# Patient Record
Sex: Female | Born: 1995 | Race: Black or African American | Hispanic: No | Marital: Single | Smoking: Former smoker
Health system: Southern US, Community
[De-identification: ages and names within clinical notes are randomized; demographics above are authoritative.]

## PROBLEM LIST (undated history)

## (undated) DIAGNOSIS — F329 Major depressive disorder, single episode, unspecified: Secondary | ICD-10-CM

## (undated) DIAGNOSIS — IMO0002 Reserved for concepts with insufficient information to code with codable children: Secondary | ICD-10-CM

## (undated) HISTORY — PX: APPENDECTOMY: SHX54

## (undated) HISTORY — DX: Reserved for concepts with insufficient information to code with codable children: IMO0002

---

## 2014-11-26 ENCOUNTER — Emergency Department (HOSPITAL_COMMUNITY)
Admission: EM | Admit: 2014-11-26 | Discharge: 2014-11-26 | Disposition: A | Discharge status: Home / Self Care | Attending: Emergency Medicine | Admitting: Emergency Medicine

## 2014-11-26 ENCOUNTER — Encounter (HOSPITAL_COMMUNITY): Payer: Self-pay | Admitting: Family Medicine

## 2014-11-26 DIAGNOSIS — R05 Cough: Secondary | ICD-10-CM | POA: Diagnosis present

## 2014-11-26 DIAGNOSIS — B349 Viral infection, unspecified: Secondary | ICD-10-CM | POA: Insufficient documentation

## 2014-11-26 MED ORDER — ONDANSETRON 8 MG PO TBDP: 8.0000 mg | ORAL_TABLET | Freq: Three times a day (TID) | ORAL | Status: DC | PRN

## 2014-11-26 NOTE — ED Provider Notes (Signed)
CSN: [id number]     Arrival date & time 11/26/14  1222 History   First MD Initiated Contact with Patient 11/26/14 1240     Chief Complaint  Patient presents with  . Cough  . Emesis     (Consider location/radiation/quality/duration/timing/severity/associated sxs/prior Treatment) HPI 19 year old previously healthy female presents today stating that she has had cough, sore throat, nausea and vomiting for 2 weeks. It began with some coughing and then she had some rhinorrhea and sore throat. Over the past 4 days she's had nausea and vomiting. She had vomited one time this morning. She has not had fever or chills with this. She has been taking by mouth fluids but not eating solid foods well. She has been going to school and going to work. She is not a smoker and denies dyspnea. She has some mild headache and continues to have some sore throat. She has not noted any swelling or exudate in her throat. Her neck has not been stiff. She has had some cough but is not short of breath. She denies any abdominal pain or diarrhea. She denies UTI symptoms. She states that she has never been sexually active and her last menstrual period was normal at the end of last month.  History reviewed. No pertinent past medical history. Past Surgical History  Procedure Laterality Date  . Appendectomy     History reviewed. No pertinent family history. History  Substance Use Topics  . Smoking status: Never Smoker   . Smokeless tobacco: Not on file  . Alcohol Use: No   OB History    No data available     Review of Systems  All other systems reviewed and are negative.     Allergies  Review of patient's allergies indicates no known allergies.  Home Medications   Prior to Admission medications   Not on File   BP 116/83 mmHg  Pulse 100  Temp(Src) 98.4 F (36.9 C) (Oral)  Resp 20  Ht  (1.702 m)  Wt 142 lb 6.4 oz (64.592 kg)  BMI 22.30 kg/m2  SpO2 98%  LMP 11/04/2014 Physical Exam   Constitutional: She is oriented to person, place, and time. She appears well-developed and well-nourished.  HENT:  Head: Normocephalic and atraumatic.  Right Ear: External ear normal.  Left Ear: External ear normal.  Nose: Nose normal.  Mouth/Throat: Oropharynx is clear and moist.  Eyes: Conjunctivae and EOM are normal. Pupils are equal, round, and reactive to light.  Neck: Normal range of motion. Neck supple.  Cardiovascular: Normal rate, regular rhythm, normal heart sounds and intact distal pulses.   Pulmonary/Chest: Effort normal and breath sounds normal.  Abdominal: Soft. Bowel sounds are normal.  Musculoskeletal: Normal range of motion.  Neurological: She is alert and oriented to person, place, and time. She has normal reflexes.  Skin: Skin is warm and dry.  Psychiatric: She has a normal mood and affect. Her behavior is normal. Judgment and thought content normal.  Nursing note and vitals reviewed.   ED Course  Procedures (including critical care time) Labs Review Labs Reviewed - No data to display  Imaging Review No results found.   EKG Interpretation None      MDM   Final diagnoses:  Nonspecific syndrome suggestive of viral illness   Patient appears well hydrated here. She has not had any difficulty taking fluids today despite the one episode of emesis. Plan is to give her Zofran and treat her symptomatically. I have discussed doing a more aggressive workup  such as blood work and chest x-Roxi Hlavaty but do not feel that that is necessary at this time. I discussed this option with patient and she is in agreement to see down the conservative route of symptomatic treatment. She will stay out of work until Tuesday. She understands return precautions of inability to keep fluids down, high fever, or worsening of pain.    Hilario Quarryanielle S Umer Harig, MD 11/26/14 [id number]

## 2014-11-26 NOTE — ED Notes (Signed)
Pt here for cough, N,V and headache x 4 days.

## 2014-11-26 NOTE — Discharge Instructions (Signed)
Cough, Adult  A cough is a reflex that helps clear your throat and airways. It can help heal the body or may be a reaction to an irritated airway. A cough may only last 2 or 3 weeks (acute) or may last more than 8 weeks (chronic).  CAUSES Acute cough:  Viral or bacterial infections. Chronic cough:  Infections.  Allergies.  Asthma.  Post-nasal drip.  Smoking.  Heartburn or acid reflux.  Some medicines.  Chronic lung problems (COPD).  Cancer. SYMPTOMS   Cough.  Fever.  Chest pain.  Increased breathing rate.  High-pitched whistling sound when breathing (wheezing).  Colored mucus that you cough up (sputum). TREATMENT   A bacterial cough may be treated with antibiotic medicine.  A viral cough must run its course and will not respond to antibiotics.  Your caregiver may recommend other treatments if you have a chronic cough. HOME CARE INSTRUCTIONS   Only take over-the-counter or prescription medicines for pain, discomfort, or fever as directed by your caregiver. Use cough suppressants only as directed by your caregiver.  Use a cold steam vaporizer or humidifier in your bedroom or home to help loosen secretions.  Sleep in a semi-upright position if your cough is worse at night.  Rest as needed.  Stop smoking if you smoke. SEEK IMMEDIATE MEDICAL CARE IF:   You have pus in your sputum.  Your cough starts to worsen.  You cannot control your cough with suppressants and are losing sleep.  You begin coughing up blood.  You have difficulty breathing.  You develop pain which is getting worse or is uncontrolled with medicine.  You have a fever. MAKE SURE YOU:   Understand these instructions.  Will watch your condition.  Will get help right away if you are not doing well or get worse. Document Released: 03/22/2011 Document Revised: 12/16/2011 Document Reviewed: 03/22/2011 [facility] Patient Information 2015 [place], [place]. This information is not intended  to replace advice given to you by your health care provider. Make sure you discuss any questions you have with your health care provider. Nausea and Vomiting Nausea means you feel sick to your stomach. Throwing up (vomiting) is a reflex where stomach contents come out of your mouth. HOME CARE   Take medicine as told by your doctor.  Do not force yourself to eat. However, you do need to drink fluids.  If you feel like eating, eat a normal diet as told by your doctor.  Eat rice, wheat, potatoes, bread, lean meats, yogurt, fruits, and vegetables.  Avoid high-fat foods.  Drink enough fluids to keep your pee (urine) clear or pale yellow.  Ask your doctor how to replace body fluid losses (rehydrate). Signs of body fluid loss (dehydration) include:  Feeling very thirsty.  Dry lips and mouth.  Feeling dizzy.  Dark pee.  Peeing less than normal.  Feeling confused.  Fast breathing or heart rate. GET HELP RIGHT AWAY IF:   You have blood in your throw up.  You have black or bloody poop (stool).  You have a bad headache or stiff neck.  You feel confused.  You have bad belly (abdominal) pain.  You have chest pain or trouble breathing.  You do not pee at least once every 8 hours.  You have cold, clammy skin.  You keep throwing up after 24 to 48 hours.  You have a fever. MAKE SURE YOU:   Understand these instructions.  Will watch your condition.  Will get help right away if you are  not doing well or get worse. Document Released: 03/11/2008 Document Revised: 12/16/2011 Document Reviewed: 02/22/2011 [facility] Patient Information 2015 [place], [place]. This information is not intended to replace advice given to you by your health care provider. Make sure you discuss any questions you have with your health care provider.

## 2015-06-03 ENCOUNTER — Encounter (HOSPITAL_COMMUNITY): Payer: Self-pay | Admitting: Nurse Practitioner

## 2015-06-03 ENCOUNTER — Emergency Department (HOSPITAL_COMMUNITY)
Admission: EM | Admit: 2015-06-03 | Discharge: 2015-06-03 | Disposition: A | Discharge status: Home / Self Care | Attending: Emergency Medicine | Admitting: Emergency Medicine

## 2015-06-03 DIAGNOSIS — S0993XA Unspecified injury of face, initial encounter: Secondary | ICD-10-CM | POA: Diagnosis present

## 2015-06-03 DIAGNOSIS — S025XXA Fracture of tooth (traumatic), initial encounter for closed fracture: Secondary | ICD-10-CM | POA: Insufficient documentation

## 2015-06-03 DIAGNOSIS — Y9389 Activity, other specified: Secondary | ICD-10-CM | POA: Insufficient documentation

## 2015-06-03 DIAGNOSIS — Y9289 Other specified places as the place of occurrence of the external cause: Secondary | ICD-10-CM | POA: Insufficient documentation

## 2015-06-03 DIAGNOSIS — X58XXXA Exposure to other specified factors, initial encounter: Secondary | ICD-10-CM | POA: Diagnosis not present

## 2015-06-03 DIAGNOSIS — Y998 Other external cause status: Secondary | ICD-10-CM | POA: Diagnosis not present

## 2015-06-03 MED ORDER — NAPROXEN 500 MG PO TABS: 500.0000 mg | ORAL_TABLET | Freq: Two times a day (BID) | ORAL | Status: DC

## 2015-06-03 MED ORDER — AMOXICILLIN 500 MG PO CAPS: 500.0000 mg | ORAL_CAPSULE | Freq: Three times a day (TID) | ORAL | Status: DC

## 2015-06-03 NOTE — Discharge Instructions (Signed)
°  Call Dr. Kelly Splinter office Monday morning and tell them you were seen here and need a follow up for your broken tooth.

## 2015-06-03 NOTE — ED Notes (Signed)
She states she has a chipped tooth on R upper gum that has come loose and now she can feel a piece of it wiggling when she tries to eat

## 2015-06-03 NOTE — ED Notes (Signed)
Declined W/C at D/C and was escorted to lobby by RN. 

## 2015-06-03 NOTE — ED Provider Notes (Signed)
CSN: [id number]     Arrival date & time 06/03/15  1537 History  This chart was scribed for [facility], NP working with Tilden Fossa, MD by Evon Slack, ED Scribe. This patient was seen in room TR05C/TR05C and the patient's care was started at 4:00 PM.    Chief Complaint  Patient presents with  . Dental Pain   Patient is a 19 y.o. female presenting with tooth pain. The history is provided by the patient. No language interpreter was used.  Dental Pain Location:  Upper Upper teeth location:  3/RU 1st molar Severity:  Moderate Duration:  1 day Timing:  Constant Chronicity:  New Context: dental fracture   Relieved by:  None tried Worsened by:  Hot food/drink Associated symptoms: no fever    HPI Comments: Tammy Lowe is a 19 y.o. female who presents to the Emergency Department complaining of right upper dental pain onset this morning at 2 AM. Pt states she bit down on a fork and chipped the tooth. She states that the pain is worse when eating. Pt doesn't report any medications PTA.  Pt denies fever, chills or ear pain. Pt states that she does not have a dentist.   History reviewed. No pertinent past medical history. Past Surgical History  Procedure Laterality Date  . Appendectomy     History reviewed. No pertinent family history. Social History  Substance Use Topics  . Smoking status: Never Smoker   . Smokeless tobacco: None  . Alcohol Use: Yes   OB History    No data available     Review of Systems  Constitutional: Negative for fever and chills.  HENT: Positive for dental problem. Negative for ear pain.   All other systems reviewed and are negative.     Allergies  Review of patient's allergies indicates no known allergies.  Home Medications   Prior to Admission medications   Medication Sig Start Date End Date Taking? Authorizing Provider  amoxicillin (AMOXIL) 500 MG capsule Take 1 capsule (500 mg total) by mouth 3 (three) times daily. 06/03/15   [name], NP  naproxen (NAPROSYN) 500 MG tablet Take 1 tablet (500 mg total) by mouth 2 (two) times daily. 06/03/15   [name], NP  ondansetron (ZOFRAN ODT) 8 MG disintegrating tablet Take 1 tablet (8 mg total) by mouth every 8 (eight) hours as needed for nausea or vomiting. 11/26/14   Margarita Grizzle, MD   BP 103/65 mmHg  Pulse 70  Temp(Src) 98.5 F (36.9 C) (Oral)  Resp 18  SpO2 97%   Physical Exam  Constitutional: She is oriented to person, place, and time. She appears well-developed and well-nourished. No distress.  HENT:  Head: Normocephalic and atraumatic.  Mouth/Throat: Uvula is midline, oropharynx is clear and moist and mucous membranes are normal.    Right upper first molar broken and loose.   Eyes: Conjunctivae and EOM are normal.  Neck: Normal range of motion. Neck supple. No tracheal deviation present.  Cardiovascular: Normal rate and regular rhythm.   Pulmonary/Chest: Effort normal and breath sounds normal. No respiratory distress.  Musculoskeletal: Normal range of motion.  Lymphadenopathy:    She has no cervical adenopathy.  Neurological: She is alert and oriented to person, place, and time.  Skin: Skin is warm and dry.  Psychiatric: She has a normal mood and affect. Her behavior is normal.  Nursing note and vitals reviewed.   ED Course  Procedures (including critical care time) DIAGNOSTIC STUDIES: Oxygen Saturation  is 97% on RA, normal by my interpretation.    COORDINATION OF CARE: 4:07 PM-Discussed treatment plan with pt at bedside and pt agreed to plan.     MDM  19 y.o. female with dental pain due to broken tooth. Stable for d/c without fever and does not appear toxic. Will start antibiotics and NSAIDS and patient will call the on call dentist for a follow up appointment.   Final diagnoses:  Broken tooth, closed, initial encounter   I personally performed the services described in this documentation, which was scribed in my presence. The recorded  information has been reviewed and is accurate.      [address] [place], NP 06/03/15 Merrily Brittle  Tilden Fossa, MD 06/04/15 [phone]

## 2016-03-06 ENCOUNTER — Encounter (HOSPITAL_COMMUNITY): Payer: Self-pay | Admitting: *Deleted

## 2016-03-06 ENCOUNTER — Emergency Department (HOSPITAL_COMMUNITY)
Admission: EM | Admit: 2016-03-06 | Discharge: 2016-03-06 | Disposition: A | Discharge status: Home / Self Care | Attending: Emergency Medicine | Admitting: Emergency Medicine

## 2016-03-06 ENCOUNTER — Emergency Department (HOSPITAL_COMMUNITY)

## 2016-03-06 DIAGNOSIS — N644 Mastodynia: Secondary | ICD-10-CM | POA: Diagnosis present

## 2016-03-06 DIAGNOSIS — R0789 Other chest pain: Secondary | ICD-10-CM | POA: Diagnosis not present

## 2016-03-06 DIAGNOSIS — Z791 Long term (current) use of non-steroidal anti-inflammatories (NSAID): Secondary | ICD-10-CM | POA: Insufficient documentation

## 2016-03-06 DIAGNOSIS — F17210 Nicotine dependence, cigarettes, uncomplicated: Secondary | ICD-10-CM | POA: Diagnosis not present

## 2016-03-06 LAB — URINALYSIS, ROUTINE W REFLEX MICROSCOPIC
BILIRUBIN URINE: NEGATIVE
Glucose, UA: NEGATIVE mg/dL
Hgb urine dipstick: NEGATIVE
Ketones, ur: NEGATIVE mg/dL
LEUKOCYTES UA: NEGATIVE
NITRITE: NEGATIVE
PROTEIN: NEGATIVE mg/dL
Specific Gravity, Urine: 1.033 — ABNORMAL HIGH (ref 1.005–1.030)
pH: 5.5 (ref 5.0–8.0)

## 2016-03-06 LAB — PREGNANCY, URINE: Preg Test, Ur: NEGATIVE

## 2016-03-06 MED ORDER — ORPHENADRINE CITRATE ER 100 MG PO TB12: 100.0000 mg | ORAL_TABLET | Freq: Two times a day (BID) | ORAL | Status: DC

## 2016-03-06 MED ORDER — IBUPROFEN 800 MG PO TABS
800.0000 mg | ORAL_TABLET | Freq: Once | ORAL | Status: AC
  Administered 2016-03-06: 800 mg via ORAL
  Filled 2016-03-06: qty 1

## 2016-03-06 MED ORDER — IBUPROFEN 600 MG PO TABS: 600.0000 mg | ORAL_TABLET | Freq: Four times a day (QID) | ORAL | Status: DC | PRN

## 2016-03-06 NOTE — Discharge Instructions (Signed)

## 2016-03-06 NOTE — ED Notes (Signed)
Pt reports L side pain under her L breast x "a few days", worse with inspiration or when coughing or laughing.  Denies any radiation at this time.

## 2016-03-06 NOTE — ED Provider Notes (Signed)
CSN: [id number]     Arrival date & time 03/06/16  1451 History   First MD Initiated Contact with Patient 03/06/16 1530     Chief Complaint  Patient presents with  . L side pain      (Consider location/radiation/quality/duration/timing/severity/associated sxs/prior Treatment) HPI Patient has had pain under her left breast for about 3 days. Very sharp. It is worse with movement cough or laughing. She reports her couple weeks she's had a mild cough. No sputum production and no fever or chills. She reports that she dances a lot but denies any specific movements or activities she is done to strain her chest wall. She reports however movements do make it worse. No history of DVT or PE. He is currently on menstrual cycle denies use of birth control. No recent travel. History reviewed. No pertinent past medical history. Past Surgical History  Procedure Laterality Date  . Appendectomy     No family history on file. Social History  Substance Use Topics  . Smoking status: Current Some Day Smoker    Types: Cigarettes  . Smokeless tobacco: None  . Alcohol Use: Yes     Comment: occa   OB History    No data available     Review of Systems 10 Systems reviewed and are negative for acute change except as noted in the HPI.    Allergies  Review of patient's allergies indicates no known allergies.  Home Medications   Prior to Admission medications   Medication Sig Start Date End Date Taking? Authorizing Provider  amoxicillin (AMOXIL) 500 MG capsule Take 1 capsule (500 mg total) by mouth 3 (three) times daily. Patient not taking: Reported on 03/06/2016 06/03/15   Janne NapoleonHope M Neese, NP  ibuprofen (ADVIL,MOTRIN) 600 MG tablet Take 1 tablet (600 mg total) by mouth every 6 (six) hours as needed. 03/06/16   Arby BarretteMarcy Stanislaw Acton, MD  naproxen (NAPROSYN) 500 MG tablet Take 1 tablet (500 mg total) by mouth 2 (two) times daily. Patient not taking: Reported on 03/06/2016 06/03/15   Janne NapoleonHope M Neese, NP  ondansetron  (ZOFRAN ODT) 8 MG disintegrating tablet Take 1 tablet (8 mg total) by mouth every 8 (eight) hours as needed for nausea or vomiting. Patient not taking: Reported on 03/06/2016 11/26/14   Margarita Grizzleanielle Ray, MD  orphenadrine (NORFLEX) 100 MG tablet Take 1 tablet (100 mg total) by mouth 2 (two) times daily. 03/06/16   Arby BarretteMarcy Shamel Germond, MD   BP 110/79 mmHg  Pulse 71  Temp(Src) 98.5 F (36.9 C) (Oral)  Resp 17  Ht 5\' 7"  (1.702 m)  Wt 125 lb (56.7 kg)  BMI 19.57 kg/m2  SpO2 99%  LMP 03/04/2016 Physical Exam  Constitutional: She is oriented to person, place, and time. She appears well-developed and well-nourished.  HENT:  Head: Normocephalic and atraumatic.  Eyes: EOM are normal. Pupils are equal, round, and reactive to light.  Neck: Neck supple.  Cardiovascular: Normal rate, regular rhythm, normal heart sounds and intact distal pulses.   Pulmonary/Chest: Effort normal and breath sounds normal. She exhibits tenderness.  Very focal and reproducible pain along the ninth and 10th ribs. Patient winces with palpation in this localized region. No rash or palpable anomaly.  Abdominal: Soft. Bowel sounds are normal. She exhibits no distension. There is no tenderness.  Musculoskeletal: Normal range of motion. She exhibits no edema or tenderness.  Neurological: She is alert and oriented to person, place, and time. She has normal strength. Coordination normal. GCS eye subscore is 4. GCS verbal subscore  is 5. GCS motor subscore is 6.  Skin: Skin is warm, dry and intact.  Psychiatric: She has a normal mood and affect.    ED Course  Procedures (including critical care time) Labs Review Labs Reviewed  URINALYSIS, ROUTINE W REFLEX MICROSCOPIC (NOT AT [facility]) - Abnormal; Notable for the following:    Specific Gravity, Urine 1.033 (*)    All other components within normal limits  PREGNANCY, URINE    Imaging Review Dg Chest 2 View  03/06/2016  CLINICAL DATA:  Left-sided chest pain and dry cough 1 week. EXAM:  CHEST  2 VIEW COMPARISON:  None. FINDINGS: The heart size and mediastinal contours are within normal limits. Both lungs are clear. The visualized skeletal structures are unremarkable. IMPRESSION: No active cardiopulmonary disease. Electronically Signed   By: Elberta Fortis M.D.   On: 03/06/2016 16:09   I have personally reviewed and evaluated these images and lab results as part of my medical decision-making.   EKG Interpretation None      MDM   Final diagnoses:  Chest wall pain   Patient is clinically well appearance. Stable vital signs. No recent travel, birth control or PE DVT risk factors. Chest pain is very reproducible on examination. This is consistent with chest wall strain. Patient be treated with ibuprofen and Norflex for muscle spasm. Return instructions reviewed. Patient is counseled on the necessity of follow-up.    Arby Barrette, MD 03/06/16 [phone]

## 2016-09-02 ENCOUNTER — Emergency Department (HOSPITAL_COMMUNITY)
Admission: EM | Admit: 2016-09-02 | Discharge: 2016-09-02 | Disposition: A | Discharge status: Home / Self Care | Attending: Emergency Medicine | Admitting: Emergency Medicine

## 2016-09-02 ENCOUNTER — Encounter (HOSPITAL_COMMUNITY): Payer: Self-pay | Admitting: *Deleted

## 2016-09-02 DIAGNOSIS — F17210 Nicotine dependence, cigarettes, uncomplicated: Secondary | ICD-10-CM | POA: Insufficient documentation

## 2016-09-02 DIAGNOSIS — N946 Dysmenorrhea, unspecified: Secondary | ICD-10-CM | POA: Diagnosis present

## 2016-09-02 LAB — POC URINE PREG, ED: Preg Test, Ur: NEGATIVE

## 2016-09-02 MED ORDER — NAPROXEN 500 MG PO TABS: 500.0000 mg | ORAL_TABLET | Freq: Two times a day (BID) | ORAL | 0 refills | Status: DC

## 2016-09-02 MED ORDER — ONDANSETRON 4 MG PO TBDP
4.0000 mg | ORAL_TABLET | Freq: Once | ORAL | Status: AC
  Administered 2016-09-02: 4 mg via ORAL

## 2016-09-02 MED ORDER — ONDANSETRON 4 MG PO TBDP
ORAL_TABLET | ORAL | Status: AC
  Filled 2016-09-02: qty 1

## 2016-09-02 MED ORDER — NAPROXEN 250 MG PO TABS
500.0000 mg | ORAL_TABLET | Freq: Once | ORAL | Status: AC
  Administered 2016-09-02: 500 mg via ORAL
  Filled 2016-09-02: qty 2

## 2016-09-02 NOTE — Discharge Instructions (Signed)
[facility]. Other option is to call your insurance company and get a list of Gynecologist who are under your insurance network. As discussed - close gynecologist follow up will be very helpful.

## 2016-09-02 NOTE — ED Notes (Signed)
Patient moved physically from hallway to room A10; patient getting undressed and into a gown at this time

## 2016-09-02 NOTE — ED Triage Notes (Addendum)
Pt states the last 12 menstrual cycles have been increasingly painful.  Started this cycle last night.  C/o nausea with pain.  Pt has been taking 500 mg ibuprofen with no relief.

## 2016-09-02 NOTE — ED Provider Notes (Signed)
[facility]-EMERGENCY DEPT Provider Note   CSN: [id number] Arrival date & time: 09/02/16  [id number]     History   Chief Complaint Chief Complaint  Patient presents with  . Abdominal Cramping    MENSTRUAL    HPI Tammy Lowe is a 20 y.o. female.  HPI Pt with hx of appendectomy comes in with cc of menstrual cramps. Pt reports that for the last 1 month she has been having regular periods, but with them she gets severe cramping pain. The cramping pain is L sided and in the suprapubic region. Pt has no vaginal discharge with it. Pt denies any hx of STD and denies unprotected intercourse or multiple partners or high risk sexual behavior. She does indicate that her mother needed hysterectomy and there is endometriosis in her family, so she wanted to be checked out.    History reviewed. No pertinent past medical history.  There are no active problems to display for this patient.   Past Surgical History:  Procedure Laterality Date  . APPENDECTOMY      OB History    No data available       Home Medications    Prior to Admission medications   Medication Sig Start Date End Date Taking? Authorizing Provider  amoxicillin (AMOXIL) 500 MG capsule Take 1 capsule (500 mg total) by mouth 3 (three) times daily. Patient not taking: Reported on 03/06/2016 06/03/15   Janne NapoleonHope M Neese, NP  ibuprofen (ADVIL,MOTRIN) 600 MG tablet Take 1 tablet (600 mg total) by mouth every 6 (six) hours as needed. 03/06/16   Arby BarretteMarcy Pfeiffer, MD  naproxen (NAPROSYN) 500 MG tablet Take 1 tablet (500 mg total) by mouth 2 (two) times daily. 09/02/16   Derwood KaplanAnkit Coraleigh Sheeran, MD  ondansetron (ZOFRAN ODT) 8 MG disintegrating tablet Take 1 tablet (8 mg total) by mouth every 8 (eight) hours as needed for nausea or vomiting. Patient not taking: Reported on 03/06/2016 11/26/14   Margarita Grizzleanielle Ray, MD  orphenadrine (NORFLEX) 100 MG tablet Take 1 tablet (100 mg total) by mouth 2 (two) times daily. 03/06/16   Arby BarretteMarcy Pfeiffer, MD    Family  History No family history on file.  Social History Social History  Substance Use Topics  . Smoking status: Current Some Day Smoker    Packs/day: 0.25    Years: 0.00    Types: Cigarettes  . Smokeless tobacco: Never Used  . Alcohol use Yes     Comment: occa     Allergies   Patient has no known allergies.   Review of Systems Review of Systems  Constitutional: Positive for activity change.  Gastrointestinal: Positive for abdominal pain. Negative for nausea and vomiting.  Genitourinary: Positive for pelvic pain. Negative for difficulty urinating, dysuria, flank pain, frequency, hematuria, menstrual problem, vaginal bleeding, vaginal discharge and vaginal pain.  Allergic/Immunologic: Negative for immunocompromised state.  Hematological: Does not bruise/bleed easily.     Physical Exam Updated Vital Signs BP 119/89 (BP Location: Right Arm)   Pulse 61   Temp 98.3 F (36.8 C) (Oral)   Resp 16   Ht 5\' 7"  (1.702 m)   Wt 122 lb (55.3 kg)   LMP 09/01/2016   SpO2 99%   BMI 19.11 kg/m   Physical Exam  Constitutional: She is oriented to person, place, and time. She appears well-developed.  HENT:  Head: Normocephalic and atraumatic.  Eyes: EOM are normal.  Neck: Normal range of motion. Neck supple.  Cardiovascular: Normal rate.   Pulmonary/Chest: Effort normal.  Abdominal: Bowel sounds  are normal. There is tenderness.  LLQ and suprapubic tenderness  Neurological: She is alert and oriented to person, place, and time.  Skin: Skin is warm and dry.  Nursing note and vitals reviewed.    ED Treatments / Results  Labs (all labs ordered are listed, but only abnormal results are displayed) Labs Reviewed  POC URINE PREG, ED    EKG  EKG Interpretation None       Radiology No results found.  Procedures Procedures (including critical care time)  Medications Ordered in ED Medications  ondansetron (ZOFRAN-ODT) 4 MG disintegrating tablet (not administered)   ondansetron (ZOFRAN-ODT) disintegrating tablet 4 mg (4 mg Oral Given 09/02/16 0731)  naproxen (NAPROSYN) tablet 500 mg (500 mg Oral Given 09/02/16 [id number])     Initial Impression / Assessment and Plan / ED Course  I have reviewed the triage vital signs and the nursing notes.  Pertinent labs & imaging results that were available during my care of the patient were reviewed by me and considered in my medical decision making (see chart for details).  Clinical Course     Pt comes in with menstrual cramps that have worsened over the course of the last year. Pain only related to menstruation. No STD risk factors. Pt has insurance, and I think her care will be best managed as an outpatient by a Gynecologist - f/u info provided. Preg test is neg. No need for emergent US or pelvic exam.  Final Clinical Impressions(s) / ED Diagnoses   Final diagnoses:  Dysmenorrhea    New Prescriptions Discharge Medication List as of 09/02/2016  9:17 AM       Derwood KaplanAnkit Poseidon Pam, MD 09/02/16 [phone]

## 2016-12-03 ENCOUNTER — Emergency Department (HOSPITAL_COMMUNITY)
Admission: EM | Admit: 2016-12-03 | Discharge: 2016-12-03 | Disposition: A | Discharge status: Home / Self Care | Attending: Emergency Medicine | Admitting: Emergency Medicine

## 2016-12-03 ENCOUNTER — Encounter (HOSPITAL_COMMUNITY): Payer: Self-pay | Admitting: Emergency Medicine

## 2016-12-03 DIAGNOSIS — X102XXA Contact with fats and cooking oils, initial encounter: Secondary | ICD-10-CM | POA: Diagnosis not present

## 2016-12-03 DIAGNOSIS — F17210 Nicotine dependence, cigarettes, uncomplicated: Secondary | ICD-10-CM | POA: Insufficient documentation

## 2016-12-03 DIAGNOSIS — Y9389 Activity, other specified: Secondary | ICD-10-CM | POA: Insufficient documentation

## 2016-12-03 DIAGNOSIS — T23262A Burn of second degree of back of left hand, initial encounter: Secondary | ICD-10-CM | POA: Insufficient documentation

## 2016-12-03 DIAGNOSIS — Y999 Unspecified external cause status: Secondary | ICD-10-CM | POA: Diagnosis not present

## 2016-12-03 DIAGNOSIS — T23062A Burn of unspecified degree of back of left hand, initial encounter: Secondary | ICD-10-CM | POA: Diagnosis present

## 2016-12-03 DIAGNOSIS — Y929 Unspecified place or not applicable: Secondary | ICD-10-CM | POA: Diagnosis not present

## 2016-12-03 MED ORDER — HYDROCODONE-ACETAMINOPHEN 5-325 MG PO TABS
1.0000 | ORAL_TABLET | Freq: Once | ORAL | Status: AC
  Administered 2016-12-03: 1 via ORAL
  Filled 2016-12-03: qty 1

## 2016-12-03 MED ORDER — IBUPROFEN 800 MG PO TABS: 800.0000 mg | ORAL_TABLET | Freq: Three times a day (TID) | ORAL | 0 refills | Status: DC

## 2016-12-03 MED ORDER — SILVER SULFADIAZINE 1 % EX CREA
TOPICAL_CREAM | Freq: Once | CUTANEOUS | Status: AC
  Administered 2016-12-03: 1 via TOPICAL
  Filled 2016-12-03: qty 50

## 2016-12-03 NOTE — ED Provider Notes (Signed)
[facility]-EMERGENCY DEPT Provider Note   CSN: [id number] Arrival date & time: 12/03/16  2136     History   Chief Complaint Chief Complaint  Patient presents with  . Burn    HPI ARLAYNE LIGGINS is a 21 y.o. female.  The history is provided by the patient and medical records. No language interpreter was used.  Burn    Ottie Glazier is an otherwise healthy right hand dominant 21 y.o. female  who presents to the Emergency Department for burn to the left hand just prior to arrival. Patient states that she was cooking when she accidentally poured baking grease onto the left hand. It began to blister shortly after and she experienced 10/10 pain. She ran her hand under cold water provided adequate pain relief, however she states every time she takes her hand out of the water, pain returns. She denies any numbness or tingling. Denies any difficulty moving the hand. No medications taken prior to arrival for symptoms.  History reviewed. No pertinent past medical history.  There are no active problems to display for this patient.   Past Surgical History:  Procedure Laterality Date  . APPENDECTOMY      OB History    No data available       Home Medications    Prior to Admission medications   Medication Sig Start Date End Date Taking? Authorizing Provider  amoxicillin (AMOXIL) 500 MG capsule Take 1 capsule (500 mg total) by mouth 3 (three) times daily. Patient not taking: Reported on 03/06/2016 06/03/15   Janne Napoleon, NP  ibuprofen (ADVIL,MOTRIN) 600 MG tablet Take 1 tablet (600 mg total) by mouth every 6 (six) hours as needed. 03/06/16   Arby Barrette, MD  naproxen (NAPROSYN) 500 MG tablet Take 1 tablet (500 mg total) by mouth 2 (two) times daily. 09/02/16   Derwood Kaplan, MD  ondansetron (ZOFRAN ODT) 8 MG disintegrating tablet Take 1 tablet (8 mg total) by mouth every 8 (eight) hours as needed for nausea or vomiting. Patient not taking: Reported on 03/06/2016 11/26/14   Margarita Grizzle, MD  orphenadrine (NORFLEX) 100 MG tablet Take 1 tablet (100 mg total) by mouth 2 (two) times daily. 03/06/16   Arby Barrette, MD    Family History History reviewed. No pertinent family history.  Social History Social History  Substance Use Topics  . Smoking status: Current Some Day Smoker    Packs/day: 0.25    Years: 0.00    Types: Cigarettes  . Smokeless tobacco: Never Used  . Alcohol use Yes     Comment: occa     Allergies   Patient has no known allergies.   Review of Systems Review of Systems  Musculoskeletal: Negative for joint swelling.  Skin: Positive for wound.  Neurological: Negative for weakness and numbness.     Physical Exam Updated Vital Signs BP 131/85 (BP Location: Right Arm)   Pulse 78   Temp 98.4 F (36.9 C) (Oral)   Resp 16   Ht 5\' 7"  (1.702 m)   SpO2 100%   Physical Exam  Constitutional: She is oriented to person, place, and time. She appears well-developed and well-nourished. No distress.  HENT:  Head: Normocephalic and atraumatic.  Cardiovascular: Normal rate, regular rhythm and normal heart sounds.   No murmur heard. Pulmonary/Chest: Effort normal and breath sounds normal. No respiratory distress.  Musculoskeletal:  Left hand with full range of motion and good grip strength. 2+ radial pulse. Sensation intact.  Neurological: She is alert  and oriented to person, place, and time.  Skin: Skin is warm and dry.  3x6 cm intact blister to the dorsum of the left hand which is very tender to palpation.  Nursing note and vitals reviewed.    ED Treatments / Results  Labs (all labs ordered are listed, but only abnormal results are displayed) Labs Reviewed - No data to display  EKG  EKG Interpretation None       Radiology No results found.  Procedures Procedures (including critical care time)  Medications Ordered in ED Medications  silver sulfADIAZINE (SILVADENE) 1 % cream (1 application Topical Given 12/03/16 2318)    HYDROcodone-acetaminophen (NORCO/VICODIN) 5-325 MG per tablet 1 tablet (1 tablet Oral Given 12/03/16 2318)     Initial Impression / Assessment and Plan / ED Course  I have reviewed the triage vital signs and the nursing notes.  Pertinent labs & imaging results that were available during my care of the patient were reviewed by me and considered in my medical decision making (see chart for details).    Ottie GlazierLateisha P Loma is a 21 y.o. female who presents to ED for burn to the dorsum of the left hand. Blisters are intact on exam. LUE NVI. Pain controlled in ED. Silvadene cream provided. Symptomatic home/wound care instructions discussed. Ibuprofen as needed for pain. PCP follow-up strongly encouraged. Reasons to return to ER including signs of infection were discussed and all questions were answered.  Final Clinical Impressions(s) / ED Diagnoses   Final diagnoses:  None    New Prescriptions New Prescriptions   No medications on file     [facility] HospitalJaime Pilcher Cintia Gleed, PA-C 12/03/16 2328    Dione Boozeavid Glick, MD 12/04/16 [phone]

## 2016-12-03 NOTE — Discharge Instructions (Signed)
Ibuprofen as needed for pain.  Return to ER for signs of infection such as worsening redness, drainage from the wound, fevers. You may also return for new or worsening symptoms, ay additional concerns.   SKIN BURN TREATMENT -- Small superficial or superficial partial-thickness burns can often be treated at home. However, burns that are larger or deeper should be evaluated by a healthcare provider.  Home treatment of skin burns should include cleaning the area, immediately cooling it, preventing infection, and managing pain.  Clean the area -- Remove any clothing from the burned area. If clothing is stuck to the skin, do not try to remove it and seek emergency medical care.  Wash the burned skin gently with cool tap water and plain soap. It is not necessary to disinfect the skin with alcohol, iodine, or other cleansers.  Cool the area -- After cleaning the skin, you may apply a cold compress to the skin or soak the area in cool (not ice) water for a brief period of time to reduce pain and reduce the extent of the burn. Avoid placing ice directly on the skin because this can damage the skin further.  Prevent infection -- To prevent infection in partial-thickness and more severe burns, apply aloe vera or an antibiotic cream, such as bacitracin. Do not apply ointments or other substances (eg, mustard, egg whites, mayonnaise, lavender oil, emu oil, toothpaste) to skin burns. Keep burns clean by washing the burned area daily with soap (does not need to be antibacterial) and water.  Minor burns may be covered with a bandage or dressing if you wish; burns that form blisters should be covered with a clean bandage or dressing. A bandage that does not stick to the skin (labeled as "non-stick") is preferred for the first layer. Change the dressing once or twice per day, as needed.  Do not try to break open skin blisters with a needle or fingernail because this can increase the risk of skin infection. The  blister will open and drain on its own.

## 2016-12-03 NOTE — ED Triage Notes (Signed)
Patient reports burn to left hand from bacon grease. Large blister noted to left hand. Ambulatory to triage.

## 2018-06-11 ENCOUNTER — Other Ambulatory Visit: Payer: Self-pay

## 2018-06-11 ENCOUNTER — Inpatient Hospital Stay (HOSPITAL_COMMUNITY)
Admission: EM | Admit: 2018-06-11 | Discharge: 2018-06-15 | DRG: 885 | Disposition: A | Discharge status: Other Acute Inpatient Hospital | Payer: Federal, State, Local not specified - Other | Attending: Internal Medicine | Admitting: Internal Medicine

## 2018-06-11 ENCOUNTER — Encounter (HOSPITAL_COMMUNITY): Payer: Self-pay | Admitting: Internal Medicine

## 2018-06-11 ENCOUNTER — Inpatient Hospital Stay (HOSPITAL_COMMUNITY): Payer: Self-pay

## 2018-06-11 DIAGNOSIS — T1491XA Suicide attempt, initial encounter: Secondary | ICD-10-CM | POA: Diagnosis present

## 2018-06-11 DIAGNOSIS — E876 Hypokalemia: Secondary | ICD-10-CM | POA: Diagnosis present

## 2018-06-11 DIAGNOSIS — K920 Hematemesis: Secondary | ICD-10-CM | POA: Diagnosis present

## 2018-06-11 DIAGNOSIS — F32A Depression, unspecified: Secondary | ICD-10-CM

## 2018-06-11 DIAGNOSIS — F332 Major depressive disorder, recurrent severe without psychotic features: Principal | ICD-10-CM | POA: Diagnosis present

## 2018-06-11 DIAGNOSIS — F17210 Nicotine dependence, cigarettes, uncomplicated: Secondary | ICD-10-CM | POA: Diagnosis present

## 2018-06-11 DIAGNOSIS — R7401 Elevation of levels of liver transaminase levels: Secondary | ICD-10-CM | POA: Diagnosis present

## 2018-06-11 DIAGNOSIS — F329 Major depressive disorder, single episode, unspecified: Secondary | ICD-10-CM | POA: Diagnosis present

## 2018-06-11 DIAGNOSIS — Z79899 Other long term (current) drug therapy: Secondary | ICD-10-CM

## 2018-06-11 DIAGNOSIS — Z23 Encounter for immunization: Secondary | ICD-10-CM

## 2018-06-11 DIAGNOSIS — R740 Nonspecific elevation of levels of transaminase and lactic acid dehydrogenase [LDH]: Secondary | ICD-10-CM

## 2018-06-11 DIAGNOSIS — R45851 Suicidal ideations: Secondary | ICD-10-CM

## 2018-06-11 DIAGNOSIS — Z7289 Other problems related to lifestyle: Secondary | ICD-10-CM | POA: Diagnosis present

## 2018-06-11 DIAGNOSIS — Z818 Family history of other mental and behavioral disorders: Secondary | ICD-10-CM

## 2018-06-11 DIAGNOSIS — E860 Dehydration: Secondary | ICD-10-CM | POA: Diagnosis present

## 2018-06-11 DIAGNOSIS — T391X1A Poisoning by 4-Aminophenol derivatives, accidental (unintentional), initial encounter: Secondary | ICD-10-CM | POA: Diagnosis present

## 2018-06-11 DIAGNOSIS — T391X2A Poisoning by 4-Aminophenol derivatives, intentional self-harm, initial encounter: Secondary | ICD-10-CM | POA: Diagnosis present

## 2018-06-11 DIAGNOSIS — Z789 Other specified health status: Secondary | ICD-10-CM

## 2018-06-11 DIAGNOSIS — T510X2A Toxic effect of ethanol, intentional self-harm, initial encounter: Secondary | ICD-10-CM | POA: Diagnosis present

## 2018-06-11 DIAGNOSIS — T50902A Poisoning by unspecified drugs, medicaments and biological substances, intentional self-harm, initial encounter: Secondary | ICD-10-CM

## 2018-06-11 DIAGNOSIS — T50901A Poisoning by unspecified drugs, medicaments and biological substances, accidental (unintentional), initial encounter: Secondary | ICD-10-CM | POA: Diagnosis present

## 2018-06-11 DIAGNOSIS — Y92009 Unspecified place in unspecified non-institutional (private) residence as the place of occurrence of the external cause: Secondary | ICD-10-CM

## 2018-06-11 HISTORY — DX: Major depressive disorder, single episode, unspecified: F32.9

## 2018-06-11 HISTORY — DX: Depression, unspecified: F32.A

## 2018-06-11 LAB — URINALYSIS, ROUTINE W REFLEX MICROSCOPIC
Bacteria, UA: NONE SEEN
Glucose, UA: NEGATIVE mg/dL
Hgb urine dipstick: NEGATIVE
Ketones, ur: 80 mg/dL — AB
Leukocytes, UA: NEGATIVE
Nitrite: NEGATIVE
Protein, ur: 100 mg/dL — AB
Specific Gravity, Urine: >1.046 — ABNORMAL HIGH (ref 1.005–1.030)
pH: 5 (ref 5.0–8.0)

## 2018-06-11 LAB — CBC
HCT: 45 % (ref 36.0–46.0)
Hemoglobin: 15.6 g/dL — ABNORMAL HIGH (ref 12.0–15.0)
MCH: 32.8 pg (ref 26.0–34.0)
MCHC: 34.7 g/dL (ref 30.0–36.0)
MCV: 94.5 fL (ref 78.0–100.0)
Platelets: 362 10*3/uL (ref 150–400)
RBC: 4.76 MIL/uL (ref 3.87–5.11)
RDW: 13.1 % (ref 11.5–15.5)
WBC: 6.4 10*3/uL (ref 4.0–10.5)

## 2018-06-11 LAB — RAPID URINE DRUG SCREEN, HOSP PERFORMED
Amphetamines: NOT DETECTED
Barbiturates: NOT DETECTED
Benzodiazepines: NOT DETECTED
Cocaine: NOT DETECTED
Opiates: NOT DETECTED
Tetrahydrocannabinol: POSITIVE — AB

## 2018-06-11 LAB — SALICYLATE LEVEL: Salicylate Lvl: <7 mg/dL (ref 2.8–30.0)

## 2018-06-11 LAB — COMPREHENSIVE METABOLIC PANEL
ALT: 114 U/L — ABNORMAL HIGH (ref 0–44)
AST: 133 U/L — ABNORMAL HIGH (ref 15–41)
Albumin: 5.6 g/dL — ABNORMAL HIGH (ref 3.5–5.0)
Alkaline Phosphatase: 58 U/L (ref 38–126)
Anion gap: 15 (ref 5–15)
BUN: 14 mg/dL (ref 6–20)
CO2: 25 mmol/L (ref 22–32)
Calcium: 10.7 mg/dL — ABNORMAL HIGH (ref 8.9–10.3)
Chloride: 102 mmol/L (ref 98–111)
Creatinine, Ser: 0.79 mg/dL (ref 0.44–1.00)
GFR calc Af Amer: >60 mL/min (ref >60)
GFR calc non Af Amer: >60 mL/min (ref >60)
Glucose, Bld: 135 mg/dL — ABNORMAL HIGH (ref 70–99)
Potassium: 3 mmol/L — ABNORMAL LOW (ref 3.5–5.1)
Sodium: 142 mmol/L (ref 135–145)
Total Bilirubin: 0.7 mg/dL (ref 0.3–1.2)
Total Protein: 9.5 g/dL — ABNORMAL HIGH (ref 6.5–8.1)

## 2018-06-11 LAB — TYPE AND SCREEN
ABO/RH(D): O POS
Antibody Screen: NEGATIVE

## 2018-06-11 LAB — HEMOGLOBIN AND HEMATOCRIT, BLOOD
HEMATOCRIT: 43.2 % (ref 36.0–46.0)
HEMOGLOBIN: 14.7 g/dL (ref 12.0–15.0)

## 2018-06-11 LAB — MAGNESIUM: Magnesium: 2.3 mg/dL (ref 1.7–2.4)

## 2018-06-11 LAB — PROTIME-INR
INR: 1.07
Prothrombin Time: 13.9 seconds (ref 11.4–15.2)

## 2018-06-11 LAB — I-STAT BETA HCG BLOOD, ED (MC, WL, AP ONLY): I-stat hCG, quantitative: <5 m[IU]/mL (ref <5)

## 2018-06-11 LAB — APTT: APTT: 30 s (ref 24–36)

## 2018-06-11 LAB — POC OCCULT BLOOD, ED: Fecal Occult Bld: NEGATIVE

## 2018-06-11 LAB — CBG MONITORING, ED: Glucose-Capillary: 131 mg/dL — ABNORMAL HIGH (ref 70–99)

## 2018-06-11 LAB — OCCULT BLOOD GASTRIC / DUODENUM (SPECIMEN CUP)
Occult Blood, Gastric: NEGATIVE
pH, Gastric: 2

## 2018-06-11 LAB — ABO/RH: ABO/RH(D): O POS

## 2018-06-11 LAB — ACETAMINOPHEN LEVEL: Acetaminophen (Tylenol), Serum: 41 ug/mL — ABNORMAL HIGH (ref 10–30)

## 2018-06-11 LAB — ETHANOL: Alcohol, Ethyl (B): <10 mg/dL (ref <10)

## 2018-06-11 MED ORDER — ADULT MULTIVITAMIN W/MINERALS CH: 1.0000 | ORAL_TABLET | Freq: Every day | ORAL | Status: DC

## 2018-06-11 MED ORDER — VITAMIN B-1 100 MG PO TABS: 100.0000 mg | ORAL_TABLET | Freq: Every day | ORAL | Status: DC

## 2018-06-11 MED ORDER — THIAMINE HCL 100 MG/ML IJ SOLN: 100.0000 mg | Freq: Every day | INTRAMUSCULAR | Status: DC

## 2018-06-11 MED ORDER — SODIUM CHLORIDE 0.9 % IV SOLN
8.0000 mg/h | INTRAVENOUS | Status: DC
  Administered 2018-06-11: 8 mg/h via INTRAVENOUS
  Filled 2018-06-11: qty 80

## 2018-06-11 MED ORDER — SODIUM CHLORIDE 0.9 % IV BOLUS
1000.0000 mL | Freq: Once | INTRAVENOUS | Status: AC
  Administered 2018-06-11: 1000 mL via INTRAVENOUS

## 2018-06-11 MED ORDER — NORGESTREL-ETHINYL ESTRADIOL 0.3-30 MG-MCG PO TABS: 1.0000 | ORAL_TABLET | Freq: Every day | ORAL | Status: DC

## 2018-06-11 MED ORDER — ACETYLCYSTEINE LOAD VIA INFUSION: 150.0000 mg/kg | Freq: Once | INTRAVENOUS | Status: DC

## 2018-06-11 MED ORDER — PROMETHAZINE HCL 25 MG PO TABS: 12.5000 mg | ORAL_TABLET | Freq: Four times a day (QID) | ORAL | Status: DC | PRN

## 2018-06-11 MED ORDER — VITAMIN B-1 100 MG PO TABS
100.0000 mg | ORAL_TABLET | Freq: Every day | ORAL | Status: DC
  Administered 2018-06-11 – 2018-06-15 (×5): 100 mg via ORAL
  Filled 2018-06-11 (×5): qty 1

## 2018-06-11 MED ORDER — TRAMADOL HCL 50 MG PO TABS: 50.0000 mg | ORAL_TABLET | Freq: Four times a day (QID) | ORAL | Status: DC | PRN

## 2018-06-11 MED ORDER — DEXTROSE 5 % IV SOLN
15.0000 mg/kg/h | INTRAVENOUS | Status: DC
  Administered 2018-06-11 – 2018-06-12 (×2): 15 mg/kg/h via INTRAVENOUS
  Filled 2018-06-11 (×5): qty 150

## 2018-06-11 MED ORDER — FLEET ENEMA 7-19 GM/118ML RE ENEM: 1.0000 | ENEMA | Freq: Once | RECTAL | Status: DC | PRN

## 2018-06-11 MED ORDER — ADULT MULTIVITAMIN W/MINERALS CH
1.0000 | ORAL_TABLET | Freq: Every day | ORAL | Status: DC
  Administered 2018-06-11 – 2018-06-15 (×5): 1 via ORAL
  Filled 2018-06-11 (×5): qty 1

## 2018-06-11 MED ORDER — LORATADINE 10 MG PO TABS
10.0000 mg | ORAL_TABLET | Freq: Every day | ORAL | Status: DC
  Administered 2018-06-11 – 2018-06-15 (×5): 10 mg via ORAL
  Filled 2018-06-11 (×5): qty 1

## 2018-06-11 MED ORDER — SORBITOL 70 % SOLN
30.0000 mL | Freq: Every day | Status: DC | PRN
  Filled 2018-06-11: qty 30

## 2018-06-11 MED ORDER — ACETYLCYSTEINE LOAD VIA INFUSION
150.0000 mg/kg | Freq: Once | INTRAVENOUS | Status: AC
  Administered 2018-06-11: 8505 mg via INTRAVENOUS
  Filled 2018-06-11: qty 213

## 2018-06-11 MED ORDER — SODIUM CHLORIDE 0.9 % IV BOLUS
500.0000 mL | Freq: Once | INTRAVENOUS | Status: AC
  Administered 2018-06-11: 500 mL via INTRAVENOUS

## 2018-06-11 MED ORDER — PROMETHAZINE HCL 25 MG/ML IJ SOLN
6.2500 mg | Freq: Once | INTRAMUSCULAR | Status: AC
  Administered 2018-06-11: 6.25 mg via INTRAVENOUS
  Filled 2018-06-11: qty 1

## 2018-06-11 MED ORDER — ONDANSETRON HCL 4 MG/2ML IJ SOLN
4.0000 mg | Freq: Once | INTRAMUSCULAR | Status: AC
  Administered 2018-06-11: 4 mg via INTRAVENOUS
  Filled 2018-06-11: qty 2

## 2018-06-11 MED ORDER — LORAZEPAM 2 MG/ML IJ SOLN: 1.0000 mg | Freq: Four times a day (QID) | INTRAMUSCULAR | Status: AC | PRN

## 2018-06-11 MED ORDER — LORAZEPAM 1 MG PO TABS
1.0000 mg | ORAL_TABLET | Freq: Four times a day (QID) | ORAL | Status: AC | PRN
  Filled 2018-06-11: qty 1

## 2018-06-11 MED ORDER — INFLUENZA VAC SPLIT QUAD 0.5 ML IM SUSY
0.5000 mL | PREFILLED_SYRINGE | INTRAMUSCULAR | Status: AC
  Administered 2018-06-12: 0.5 mL via INTRAMUSCULAR
  Filled 2018-06-11: qty 0.5

## 2018-06-11 MED ORDER — POTASSIUM CHLORIDE CRYS ER 20 MEQ PO TBCR
40.0000 meq | EXTENDED_RELEASE_TABLET | Freq: Once | ORAL | Status: AC
  Administered 2018-06-11: 40 meq via ORAL
  Filled 2018-06-11: qty 2

## 2018-06-11 MED ORDER — SODIUM CHLORIDE 0.9 % IV SOLN
INTRAVENOUS | Status: DC
  Administered 2018-06-11 – 2018-06-12 (×3): via INTRAVENOUS

## 2018-06-11 MED ORDER — FOLIC ACID 1 MG PO TABS
1.0000 mg | ORAL_TABLET | Freq: Every day | ORAL | Status: DC
  Administered 2018-06-11 – 2018-06-15 (×5): 1 mg via ORAL
  Filled 2018-06-11 (×5): qty 1

## 2018-06-11 MED ORDER — FOLIC ACID 1 MG PO TABS: 1.0000 mg | ORAL_TABLET | Freq: Every day | ORAL | Status: DC

## 2018-06-11 MED ORDER — SODIUM CHLORIDE 0.9 % IV SOLN
80.0000 mg | Freq: Once | INTRAVENOUS | Status: AC
  Administered 2018-06-11: 80 mg via INTRAVENOUS
  Filled 2018-06-11: qty 80

## 2018-06-11 MED ORDER — SENNOSIDES-DOCUSATE SODIUM 8.6-50 MG PO TABS: 1.0000 | ORAL_TABLET | Freq: Every evening | ORAL | Status: DC | PRN

## 2018-06-11 MED ORDER — PANTOPRAZOLE SODIUM 40 MG IV SOLR
40.0000 mg | Freq: Two times a day (BID) | INTRAVENOUS | Status: DC
  Administered 2018-06-11 – 2018-06-12 (×2): 40 mg via INTRAVENOUS
  Filled 2018-06-11 (×3): qty 40

## 2018-06-11 NOTE — ED Notes (Signed)
Transport called.

## 2018-06-11 NOTE — H&P (Signed)
History and Physical    [name]:[id number] DOB: 09/16/1996 DOA: 06/11/2018  PCP: Patient, No Pcp Per  Patient coming from: Home  I have personally briefly reviewed patient's old medical records in [facility]  Chief Complaint: Tylenol ingestion/hematemesis/suicidal ideation  HPI: Tammy Lowe is a 22 y.o. female with medical history significant of depression, prior suicidal attempts in the past 1 when she was 22 years old which she states she took some aspirin and 1 on New Year's Eve 2018 where she states she took a bunch of pills she is not sure of, who presents to the ED with overdosing on Tylenol.  Patient states the night prior to admission she took half a bottle of 500 mg Tylenol tablets and a suicidal attempt in addition to 6 small airplane bottles of fireball.  Patient states she woke up in the middle of the night with several bouts of nausea and greater than 10 episodes of hematemesis which she describes as a dark color.  Patient does endorse abdominal pain with hematemesis.  Patient denies any fever, no chills, no chest pain, no shortness of breath, no ongoing abdominal pain, no dysuria, no melena, no hematochezia, no syncope, no visual changes, no asymmetric weakness or numbness, no slurred speech, no focal neurological deficits.  Patient does endorse decreased oral intake over the past several days as she states she forgot to eat.  ED Course: Patient seen in the ED, comprehensive metabolic profile with a potassium of 3.0, albumin of 5.6, AST of 133, ALT of 114, protein of 9.5 otherwise was within normal limits.  CBC had a hemoglobin of 15.6 otherwise was within normal limits.  INR was 1.07.  PTT was 30, PT was 13.9.  Acetaminophen level was 41.  Salicylate level was less than 7.  Fecal occult blood was negative.  UDS was positive for THC.  Urine pregnancy was negative.  EKG with short PR interval with T wave inversions in V3 to V6.  No prior EKG.  Patient started on  Mucomyst/N-acetylcysteine in the ED after discussions with poison control.  Review of Systems: As per HPI otherwise 10 point review of systems negative.   Past Medical History:  Diagnosis Date  . Depression 06/11/2018    Past Surgical History:  Procedure Laterality Date  . APPENDECTOMY       reports that she has been smoking cigarettes. She has been smoking about 0.25 packs per day for the past 0.00 years. She has never used smokeless tobacco. She reports that she drinks alcohol. She reports that she has current or past drug history. Drug: Marijuana.  No Known Allergies  No family history on file. Mother alive age 78 with history of back problems, depression, anxiety, and migraines per patient.  Father deceased age 72 from gunshot/homicide.  Prior to Admission medications   Medication Sig Start Date End Date Taking? Authorizing Provider  loratadine (CLARITIN) 10 MG tablet Take 10 mg by mouth daily.   Yes [provider]  norgestrel-ethinyl estradiol (LO/OVRAL,CRYSELLE) 0.3-30 MG-MCG tablet Take 1 tablet by mouth daily.   Yes [provider]  amoxicillin (AMOXIL) 500 MG capsule Take 1 capsule (500 mg total) by mouth 3 (three) times daily. Patient not taking: Reported on 03/06/2016 06/03/15   Tammy Napoleon, NP  ibuprofen (ADVIL,MOTRIN) 800 MG tablet Take 1 tablet (800 mg total) by mouth 3 (three) times daily. Patient not taking: Reported on 06/11/2018 12/03/16   Ward, Chase Picket, PA-C  naproxen (NAPROSYN) 500 MG  tablet Take 1 tablet (500 mg total) by mouth 2 (two) times daily. Patient not taking: Reported on 06/11/2018 09/02/16   Derwood Kaplan, MD  ondansetron (ZOFRAN ODT) 8 MG disintegrating tablet Take 1 tablet (8 mg total) by mouth every 8 (eight) hours as needed for nausea or vomiting. Patient not taking: Reported on 03/06/2016 11/26/14   Margarita Grizzle, MD  orphenadrine (NORFLEX) 100 MG tablet Take 1 tablet (100 mg total) by mouth 2 (two) times daily. Patient not  taking: Reported on 06/11/2018 03/06/16   Arby Barrette, MD    Physical Exam: Vitals:   06/11/18 1700 06/11/18 1715 06/11/18 1745 06/11/18 1815  BP: 123/78 122/79 (!) 142/98 123/82  Pulse: 60 61 81 64  Resp: 17 18 16 16   Temp:      TempSrc:      SpO2: 100% 100% 100% 100%  Weight:      Height:        Constitutional: NAD, calm, comfortable Vitals:   06/11/18 1700 06/11/18 1715 06/11/18 1745 06/11/18 1815  BP: 123/78 122/79 (!) 142/98 123/82  Pulse: 60 61 81 64  Resp: 17 18 16 16   Temp:      TempSrc:      SpO2: 100% 100% 100% 100%  Weight:      Height:       Eyes: PERRLA, lids and conjunctivae normal ENMT: Mucous membranes are dry. Posterior pharynx clear of any exudate or lesions.Normal dentition.  Neck: normal, supple, no masses, no thyromegaly Respiratory: clear to auscultation bilaterally, no wheezing, no crackles. Normal respiratory effort. No accessory muscle use.  Cardiovascular: Regular rate and rhythm, no murmurs / rubs / gallops. No extremity edema. 2+ pedal pulses. No carotid bruits.  Abdomen: no tenderness, no masses palpated. No hepatosplenomegaly. Bowel sounds positive.  Musculoskeletal: no clubbing / cyanosis. No joint deformity upper and lower extremities. Good ROM, no contractures. Normal muscle tone.  Skin: no rashes, lesions, ulcers. No induration Neurologic: CN 2-12 grossly intact. Sensation intact, DTR normal. Strength 5/5 in all 4.  Psychiatric: Poor-Fair judgment and insight. Alert and oriented x 3. Normal mood.   Labs on Admission: I have personally reviewed following labs and imaging studies  CBC: Recent Labs  Lab 06/11/18 1353  WBC 6.4  HGB 15.6*  HCT 45.0  MCV 94.5  PLT 362   Basic Metabolic Panel: Recent Labs  Lab 06/11/18 1353  NA 142  K 3.0*  CL 102  CO2 25  GLUCOSE 135*  BUN 14  CREATININE 0.79  CALCIUM 10.7*   GFR: Estimated Creatinine Clearance: 98.7 mL/min (by C-G formula based on SCr of 0.79 mg/dL). Liver Function  Tests: Recent Labs  Lab 06/11/18 1353  AST 133*  ALT 114*  ALKPHOS 58  BILITOT 0.7  PROT 9.5*  ALBUMIN 5.6*   No results for input(s): LIPASE, AMYLASE in the last 168 hours. No results for input(s): AMMONIA in the last 168 hours. Coagulation Profile: Recent Labs  Lab 06/11/18 1353  INR 1.07   Cardiac Enzymes: No results for input(s): CKTOTAL, CKMB, CKMBINDEX, TROPONINI in the last 168 hours. BNP (last 3 results) No results for input(s): PROBNP in the last 8760 hours. HbA1C: No results for input(s): HGBA1C in the last 72 hours. CBG: Recent Labs  Lab 06/11/18 1402  GLUCAP 131*   Lipid Profile: No results for input(s): CHOL, HDL, LDLCALC, TRIG, CHOLHDL, LDLDIRECT in the last 72 hours. Thyroid Function Tests: No results for input(s): TSH, T4TOTAL, FREET4, T3FREE, THYROIDAB in the last 72 hours. Anemia  Panel: No results for input(s): VITAMINB12, FOLATE, FERRITIN, TIBC, IRON, RETICCTPCT in the last 72 hours. Urine analysis:    Component Value Date/Time   COLORURINE YELLOW 03/06/2016 1556   APPEARANCEUR CLEAR 03/06/2016 1556   LABSPEC 1.033 (H) 03/06/2016 1556   PHURINE 5.5 03/06/2016 1556   GLUCOSEU NEGATIVE 03/06/2016 1556   HGBUR NEGATIVE 03/06/2016 1556   BILIRUBINUR NEGATIVE 03/06/2016 1556   KETONESUR NEGATIVE 03/06/2016 1556   PROTEINUR NEGATIVE 03/06/2016 1556   NITRITE NEGATIVE 03/06/2016 1556   LEUKOCYTESUR NEGATIVE 03/06/2016 1556    Radiological Exams on Admission: Dg Chest 2 View  Result Date: 06/11/2018 CLINICAL DATA:  Per Pt: Pt reports she took half a bottle of 500mg  acetaminophen tablets. Pt reports it was a suicide attempt, vomiting today, pt denies any chest complaints. Smoker. EXAM: CHEST - 2 VIEW COMPARISON:  03/06/2016 FINDINGS: The heart size and mediastinal contours are within normal limits. Both lungs are clear. The visualized skeletal structures are unremarkable. IMPRESSION: No active cardiopulmonary disease. Electronically Signed   By:  Norva Pavlov M.D.   On: 06/11/2018 18:11    EKG: Independently reviewed.  Short PR interval.  Normal QTC.  T wave inversions in leads V3 through V6. Assessment/Plan Principal Problem:   Tylenol overdose Active Problems:   Overdose   Suicide attempt (HCC)   Transaminitis   Hypokalemia   Depression   Dehydration   Hematemesis/vomiting blood   Alcohol use   1 Tylenol overdose/suicidal ideation Patient presented with a Tylenol overdose with suicidal ideation.  Patient does have a history of depression.  Patient states has had past suicidal ideations last one being New Year's Eve of 2018 where she took a bunch of pills.  Patient presented to the ED after taking approximately half a bottle of 500 mg tablets of Tylenol.  Patient is unsure of the exact number of pills.  Patient seen in the ED noted to have a transaminitis as well as elevated Tylenol level of 41.  Patient has been started on N-acetylcysteine per recommendations from poison control.  Patient received a loading dose and is now on an infusion.  Repeat LFTs in the morning, coags, EKG, Tylenol level in the morning.  Supportive care.  Follow.  2.  Suicidal ideation/suicide attempt Patient presented with Tylenol overdose endorsing suicidal ideation at that time.  Patient with a history of depression.  Patient states has had 2 prior episodes of suicide attempts one when she was 22 years old and the last one on New Year's Eve of 2018.  Psychiatric consultation for further evaluation and management.  Suicide sitter.  3.  Hypokalemia Likely secondary to GI losses.  Check a magnesium level.  Replete.  4.  Hematemesis/vomiting blood Patient does endorse multiple episodes of vomiting blood which was dark as patient describes it.  Patient denies any hematuria PCR.  FOBT done was negative.  Hemoglobin on admission was 15.6 however patient noted to be dehydrated.  Concern for possible Mallory-Weiss tear versus upper GI bleed and patient with  history of alcohol use and stated occasional use of NSAIDs.  Repeat H&H.  Discontinue Protonix drip and placed on Protonix 40 mg IV every 12 hours.  Clear liquid diet.  If no further bleeding and hemoglobin stabilizes will follow.  If patient with further episodes of hematemesis with a significant drop in hemoglobin will need further evaluation with GI.  5.  Dehydration IV fluids.  6.  Transaminitis Likely secondary to Tylenol overdose.  We will check an acute hepatitis panel.  Check an abdominal ultrasound as patient with history of alcohol use.  Continue N acetylcysteine.  Repeat labs in the morning.  7.  Alcohol use Patient with ongoing alcohol use.  Alcohol level less than 10.  Will place on the Ativan withdrawal protocol.  Thiamine, folic acid, multivitamin.  Alcohol cessation.  DVT prophylaxis: SCDs Code Status: Full Family Communication: Updated patient.  No family at bedside. Disposition Plan: Pending psychiatric evaluation. Consults called: Psychiatry pending Admission status: Admit to inpatient.   Ramiro Harvest MD [facility] Pager [phone]  If 7PM-7AM, please contact night-coverage [email or web address] Password [facility]  06/11/2018, 6:25 PM

## 2018-06-11 NOTE — ED Notes (Signed)
Pt ambulated to restroom without difficulty

## 2018-06-11 NOTE — ED Notes (Signed)
Bed: [facility] Expected date:  Expected time:  Means of arrival:  Comments: 

## 2018-06-11 NOTE — ED Notes (Signed)
Pt reports time of ingestion was at 10pm on 06/10/18

## 2018-06-11 NOTE — ED Notes (Signed)
Pt ambulated to room from triage without any difficulty

## 2018-06-11 NOTE — Progress Notes (Signed)
MEDICATION RELATED CONSULT NOTE - FOLLOW UP   Pharmacy Consult for Acetylcysteine IV Indication: Suspected acetaminophen overdose  No Known Allergies  Patient Measurements: Height: 5\' 7"  (170.2 cm) Weight: 125 lb (56.7 kg) IBW/kg (Calculated) : 61.6  Vital Signs: Temp: 98.7 F (37.1 C) (09/05 1306) Temp Source: Oral (09/05 1306) BP: 120/93 (09/05 1434) Pulse Rate: 61 (09/05 1434)  Labs: Recent Labs    06/11/18 1353  WBC 6.4  HGB 15.6*  HCT 45.0  PLT 362   CrCl cannot be calculated (No successful lab value found.).   Medications:  Infusions:  . acetylcysteine    . pantoprazole (PROTONIX) IVPB 80 mg (06/11/18 1443)  . pantoprozole (PROTONIX) infusion      Assessment: 22 yoF admitted on 9/5 with attempted suicide overdose; she reports taking "half a bottle" of acetaminophen 500mg  tabs (unknown quantity) along with drinking alcohol.  She presents with hematemesis, nausea, abdominal pain.  Per Poison Control, for now give only the loading dose N-acetylcysteine 150 mg/kg over 1 hour, then based on labs, also give 15 mg/kg/hr for 23 hours if needed.  Discussed with Cortni Couture PA - due to [facility] and [facility] guardrail limitations, we will mix and send the full Acetylcysteine dose; RN can administer the loading dose only.   hCG < 5 CBC:  Hgb 15.6, Plt 362 FOB negative LFTs:  AST/ALT 133/114 INR 1.07 APAP level 41 EtOH < 10 Salicylate level < 7   Goal of Therapy:  APAP < 10  Plan:   Acetylcysteine 150 mg/kg IV loading dose  Acetylcysteine 15 mg/kg/hr infusion pending labs.  22 hour labs:  APAP level, CMET, INR  Contact [facility] [phone]) with updated results.  Lynann Beaver PharmD, BCPS Pager [phone] 06/11/2018 3:15 PM

## 2018-06-11 NOTE — ED Notes (Signed)
Sitter at bedside.

## 2018-06-11 NOTE — ED Notes (Signed)
Patient transported to X-ray 

## 2018-06-11 NOTE — ED Triage Notes (Signed)
Per Pt: Pt reports she took half a bottle of 500mg  acetaminophen tablets. Pt reports it was a suicide attempt

## 2018-06-11 NOTE — ED Provider Notes (Signed)
[facility]-EMERGENCY DEPT Provider Note   CSN: [id number] Arrival date & time: 06/11/18  1255     History   Chief Complaint Chief Complaint  Patient presents with  . Ingestion  . Hemoptysis  . Suicidal    HPI Tammy Lowe is a 22 y.o. female.  HPI  Patient is a 22 year old female with history of appendectomy presents emergency department today for evaluation after patient attempted suicide by overdosing on Tylenol.  Patient states last night she was drinking alcohol and had 3 shots of fireball.  States that she "saw an opportunity" and decided to take half a bottle of Tylenol and attempt to overdose.  States this was a larger bottle of Tylenol with 500 mg Tylenol tablets.  States that 20 minutes after this she vomited up multiple pills.  At 2 AM she woke up and continued vomiting, and began vomiting dark red clots described at "it looks like old period blood".  Also endorses a sore throat, nausea and abdominal pain.  Denies diarrhea, bloody stools, constipation or urinary symptoms.  No chest pain or shortness of breath.  States she has had chronic suicidal ideations since she was a child and has attempted suicide in the past at the earliest age of 70.  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  Currently denies suicidal ideations and states she just wants to feel better.  Past Medical History:  Diagnosis Date  . Depression 06/11/2018    Patient Active Problem List   Diagnosis Date Noted  . Overdose 06/11/2018  . Tylenol overdose 06/11/2018  . Suicide attempt (HCC) 06/11/2018  . Transaminitis 06/11/2018  . Hypokalemia 06/11/2018  . Depression 06/11/2018  . Dehydration 06/11/2018  . Hematemesis/vomiting blood 06/11/2018  . Alcohol use 06/11/2018  . Suicidal ideation     Past Surgical History:  Procedure Laterality Date  . APPENDECTOMY       OB History   None      Home Medications    Prior to Admission  medications   Medication Sig Start Date End Date Taking? Authorizing Provider  loratadine (CLARITIN) 10 MG tablet Take 10 mg by mouth daily.   Yes [provider]  norgestrel-ethinyl estradiol (LO/OVRAL,CRYSELLE) 0.3-30 MG-MCG tablet Take 1 tablet by mouth daily.   Yes [provider]  amoxicillin (AMOXIL) 500 MG capsule Take 1 capsule (500 mg total) by mouth 3 (three) times daily. Patient not taking: Reported on 03/06/2016 06/03/15   Janne Napoleon, NP  ibuprofen (ADVIL,MOTRIN) 800 MG tablet Take 1 tablet (800 mg total) by mouth 3 (three) times daily. Patient not taking: Reported on 06/11/2018 12/03/16   Ward, Chase Picket, PA-C  naproxen (NAPROSYN) 500 MG tablet Take 1 tablet (500 mg total) by mouth 2 (two) times daily. Patient not taking: Reported on 06/11/2018 09/02/16   Derwood Kaplan, MD  ondansetron (ZOFRAN ODT) 8 MG disintegrating tablet Take 1 tablet (8 mg total) by mouth every 8 (eight) hours as needed for nausea or vomiting. Patient not taking: Reported on 03/06/2016 11/26/14   Margarita Grizzle, MD  orphenadrine (NORFLEX) 100 MG tablet Take 1 tablet (100 mg total) by mouth 2 (two) times daily. Patient not taking: Reported on 06/11/2018 03/06/16   Arby Barrette, MD    Family History No family history on file.  Social History Social History   Tobacco Use  . Smoking status: Current Some Day Smoker    Packs/day: 0.25    Years: 0.00  Pack years: 0.00    Types: Cigarettes  . Smokeless tobacco: Never Used  Substance Use Topics  . Alcohol use: Yes    Comment: occa  . Drug use: Yes    Types: Marijuana     Allergies   Patient has no known allergies.   Review of Systems Review of Systems  Constitutional: Negative for fever.  HENT: Positive for sore throat.   Eyes: Negative for pain and visual disturbance.  Respiratory: Negative for shortness of breath.   Cardiovascular: Negative for chest pain.  Gastrointestinal: Positive for abdominal pain, nausea and  vomiting. Negative for blood in stool, constipation and diarrhea.       Hematemesis  Genitourinary: Negative for dysuria and urgency.  Musculoskeletal: Negative for back pain.  Skin: Negative for rash.  Neurological: Negative for headaches.   Physical Exam Updated Vital Signs BP 117/76 (BP Location: Left Arm)   Pulse 74   Temp 99.6 F (37.6 C) (Oral)   Resp 16   Ht 5\' 7"  (1.702 m)   Wt 56.7 kg   LMP 06/08/2018   SpO2 100%   BMI 19.58 kg/m   Physical Exam  Constitutional: She appears well-developed and well-nourished. No distress.  HENT:  Head: Normocephalic and atraumatic.  Mouth/Throat: Oropharynx is clear and moist.  Eyes: Conjunctivae are normal.  Neck: Neck supple.  Cardiovascular: Normal rate, regular rhythm and normal heart sounds.  No murmur heard. Pulmonary/Chest: Effort normal and breath sounds normal. No respiratory distress. She has no wheezes.  Abdominal: Soft. Bowel sounds are normal. There is tenderness (mild generalized, worse to epigastric region).  Genitourinary:  Genitourinary Comments: Chaperone present during DRE. No gross blood noted on exam. No stool palpated in the rectal vault. No TTP.  Musculoskeletal: She exhibits no edema.  Neurological: She is alert.  Skin: Skin is warm and dry. Capillary refill takes less than 2 seconds.  Psychiatric: Her behavior is normal. Judgment and thought content normal.  Flat affect  Nursing note and vitals reviewed.  ED Treatments / Results  Labs (all labs ordered are listed, but only abnormal results are displayed) Labs Reviewed  COMPREHENSIVE METABOLIC PANEL - Abnormal; Notable for the following components:      Result Value   Potassium 3.0 (*)    Glucose, Bld 135 (*)    Calcium 10.7 (*)    Total Protein 9.5 (*)    Albumin 5.6 (*)    AST 133 (*)    ALT 114 (*)    All other components within normal limits  CBC - Abnormal; Notable for the following components:   Hemoglobin 15.6 (*)    All other components  within normal limits  ACETAMINOPHEN LEVEL - Abnormal; Notable for the following components:   Acetaminophen (Tylenol), Serum 41 (*)    All other components within normal limits  RAPID URINE DRUG SCREEN, HOSP PERFORMED - Abnormal; Notable for the following components:   Tetrahydrocannabinol POSITIVE (*)    All other components within normal limits  URINALYSIS, ROUTINE W REFLEX MICROSCOPIC - Abnormal; Notable for the following components:   Color, Urine AMBER (*)    Specific Gravity, Urine >1.046 (*)    Bilirubin Urine SMALL (*)    Ketones, ur 80 (*)    Protein, ur 100 (*)    All other components within normal limits  CBG MONITORING, ED - Abnormal; Notable for the following components:   Glucose-Capillary 131 (*)    All other components within normal limits  URINE CULTURE  ETHANOL  SALICYLATE  LEVEL  PROTIME-INR  APTT  OCCULT BLOOD GASTRIC / DUODENUM (SPECIMEN CUP)  MAGNESIUM  HEMOGLOBIN AND HEMATOCRIT, BLOOD  HIV ANTIBODY (ROUTINE TESTING)  PROTIME-INR  APTT  COMPREHENSIVE METABOLIC PANEL  CBC  HEPATITIS PANEL, ACUTE  POC OCCULT BLOOD, ED  I-STAT BETA HCG BLOOD, ED (MC, WL, AP ONLY)  POCT GASTRIC OCCULT BLOOD (1-CARD TO LAB)  TYPE AND SCREEN  ABO/RH    EKG EKG Interpretation  Date/Time:  Thursday June 11 2018 13:52:40 EDT Ventricular Rate:  63 PR Interval:    QRS Duration: 95 QT Interval:  435 QTC Calculation: 446 R Axis:   76 Text Interpretation:  Sinus rhythm Atrial premature complex Borderline short PR interval Abnormal T, consider ischemia, anterior leads Confirmed by Raeford Razor [phone]) on 06/11/2018 4:29:44 PM   Radiology Dg Chest 2 View  Result Date: 06/11/2018 CLINICAL DATA:  Per Pt: Pt reports she took half a bottle of 500mg  acetaminophen tablets. Pt reports it was a suicide attempt, vomiting today, pt denies any chest complaints. Smoker. EXAM: CHEST - 2 VIEW COMPARISON:  03/06/2016 FINDINGS: The heart size and mediastinal contours are within normal  limits. Both lungs are clear. The visualized skeletal structures are unremarkable. IMPRESSION: No active cardiopulmonary disease. Electronically Signed   By: Norva Pavlov M.D.   On: 06/11/2018 18:11    Procedures Procedures (including critical care time) CRITICAL CARE Performed by: Karrie Meres   Total critical care time: 35 minutes  Critical care time was exclusive of separately billable procedures and treating other patients.  Critical care was necessary to treat or prevent imminent or life-threatening deterioration.  Critical care was time spent personally by me on the following activities: development of treatment plan with patient and/or surrogate as well as nursing, discussions with consultants, evaluation of patient's response to treatment, examination of patient, obtaining history from patient or surrogate, ordering and performing treatments and interventions, ordering and review of laboratory studies, ordering and review of radiographic studies, pulse oximetry and re-evaluation of patient's condition.   Medications Ordered in ED Medications  acetylcysteine (ACETADOTE) 40 mg/mL load via infusion 8,505 mg (8,505 mg Intravenous Bolus from Bag 06/11/18 1535)    Followed by  acetylcysteine (ACETADOTE) 30,000 mg in dextrose 5 % 750 mL (40 mg/mL) infusion (15 mg/kg/hr  56.7 kg Intravenous Rate/Dose Verify 06/11/18 1848)  Influenza vac split quadrivalent PF (FLUARIX) injection 0.5 mL (has no administration in time range)  norgestrel-ethinyl estradiol (LO/OVRAL,CRYSELLE) 0.3-30 MG-MCG per tablet 1 tablet (1 tablet Oral Not Given 06/11/18 2044)  loratadine (CLARITIN) tablet 10 mg (10 mg Oral Given 06/11/18 2041)  0.9 %  sodium chloride infusion ( Intravenous New Bag/Given 06/11/18 2039)  traMADol (ULTRAM) tablet 50 mg (has no administration in time range)  senna-docusate (Senokot-S) tablet 1 tablet (has no administration in time range)  sorbitol 70 % solution 30 mL (has no administration  in time range)  sodium phosphate (FLEET) 7-19 GM/118ML enema 1 enema (has no administration in time range)  promethazine (PHENERGAN) tablet 12.5 mg (has no administration in time range)  pantoprazole (PROTONIX) injection 40 mg (40 mg Intravenous Given 06/11/18 2055)  LORazepam (ATIVAN) tablet 1 mg (has no administration in time range)    Or  LORazepam (ATIVAN) injection 1 mg (has no administration in time range)  thiamine (VITAMIN B-1) tablet 100 mg (100 mg Oral Given 06/11/18 2041)    Or  thiamine (B-1) injection 100 mg ( Intravenous See Alternative 06/11/18 2041)  folic acid (FOLVITE) tablet 1 mg (1 mg Oral  Given 06/11/18 2041)  multivitamin with minerals tablet 1 tablet (1 tablet Oral Given 06/11/18 2041)  pantoprazole (PROTONIX) 80 mg in sodium chloride 0.9 % 100 mL IVPB (0 mg Intravenous Stopped 06/11/18 2041)  ondansetron (ZOFRAN) injection 4 mg (4 mg Intravenous Given 06/11/18 1422)  sodium chloride 0.9 % bolus 500 mL (0 mLs Intravenous Stopped 06/11/18 1657)  promethazine (PHENERGAN) injection 6.25 mg (6.25 mg Intravenous Given 06/11/18 1626)  potassium chloride SA (K-DUR,KLOR-CON) CR tablet 40 mEq (40 mEq Oral Given 06/11/18 1739)  sodium chloride 0.9 % bolus 1,000 mL (1,000 mLs Intravenous New Bag/Given 06/11/18 1921)     Initial Impression / Assessment and Plan / ED Course  I have reviewed the triage vital signs and the nursing notes.  Pertinent labs & imaging results that were available during my care of the patient were reviewed by me and considered in my medical decision making (see chart for details).   1:38 PM Spoke with the nurse, Jill Side, from poison control who agrees with current workup that has been ordered. She advised to give N-acetylcysteine 150 mg/kg over 1 hour. If patient needs to continue infusion based on lab work then 15 mg/kg/hr for 23 hours should be given. If the tylenol level is negative and LFTs are normal than will plan to call back poison control to determine if continued  n-acetylcysteine will need to be continued.  Discussed pt presentation and exam findings with Dr. Juleen China, who agrees with the plan to follow poison control recommendations and give loading dose of n-acetylcysteine. Agrees with plan for admission for observation given her hematemesis and tx with n-acetylcysteine.   Discussed dosing of n-acetylcysteine with pharmacist, Lynann Beaver.  4:11 PM CONSULT with Jill Side from poison control and discussed the lab work showing elevated LFTs and an elevated. who advised to treat pt with 15mg /kg/hr for 23 hours. Lab work should be repeated about 1 hour prior to the end of the    Final Clinical Impressions(s) / ED Diagnoses   Final diagnoses:  Toxic effect of acetaminophen, intentional self-harm, initial encounter (HCC)  Hematemesis with nausea  Suicidal ideation   Patient presenting to the ED for evaluation after suicide attempt with Tylenol last night.  Also reporting nausea, vomiting and hematemesis.  Vital signs have remained grossly stable throughout ED visit.  Abdominal exam with mild tenderness but no evidence of surgical abdomen at this time.  CBC with stable hgb at 15.6.  CMP with hypokalemia to 3.0, supplemented. Liver enzymes elevated at AST 133 and ALT 114. PT-INR is 1.07. APTT is WNL.   Point-of-care Hemoccult was negative.  Point-of-care gastric occult blood is negative.   EKG with NSR, without significant QT prolongation, PR interval is shortened. Abnormal T-waves, consider ischemia in anterior leads  UDS positive for marijuana.  EtOH is negative.  Tylenol level is elevated at 41.  Salicylate level is negative.  Patient received multiple doses of antiemetics in the ED as well as N-acetylcysteine, Protonix bolus and drip to treat potential GI bleed. Currently unclear if sxs due to mallory weiss tear.  Patient has remained stable while in the ED however poison control recommends further treatment with N-acetylcysteine given elevated  Tylenol level and elevated liver enzymes.  Patient will require admission for further treatment with N-acetylcysteine and to further monitor her given her hematemesis.  5:03 PM CONSULT with Dr. Janee Morn who will admit the patient to the hospitalist service. He advised to order a CXR and UA.  ED Discharge Orders    None  Rayne Du 06/11/18 2236    Raeford Razor, MD 06/12/18 1013

## 2018-06-11 NOTE — ED Notes (Signed)
ED TO INPATIENT HANDOFF REPORT  Name/Age/Gender Tammy Lowe 22 y.o. female  Code Status   Home/SNF/Other Home  Chief Complaint suicidal/ throwing up blood  Level of Care/Admitting Diagnosis ED Disposition    ED Disposition Condition [facility] Area: [place] [100102]  Level of Care: Telemetry [5]  Admit to tele based on following criteria: Monitor QTC interval  Diagnosis: Overdose [779390]  Admitting Physician: Eugenie Filler [place]  Attending Physician: Eugenie Filler [[id number]]  Estimated length of stay: past midnight tomorrow  Certification:: I certify this patient will need inpatient services for at least 2 midnights  PT Class (Do Not Modify): Inpatient [101]  PT Acc Code (Do Not Modify): Private [1]       Medical History No past medical history on file.  Allergies No Known Allergies  IV Location/Drains/Wounds Patient Lines/Drains/Airways Status   Active Line/Drains/Airways    Name:   Placement date:   Placement time:   Site:   Days:   Peripheral IV 06/11/18 Right Antecubital   06/11/18    1406    Antecubital   less than 1   Peripheral IV 06/11/18 Right Hand   06/11/18    1516    Hand   less than 1          Labs/Imaging Results for orders placed or performed during the hospital encounter of 06/11/18 (from the past 48 hour(s))  Comprehensive metabolic panel     Status: Abnormal   Collection Time: 06/11/18  1:53 PM  Result Value Ref Range   Sodium 142 135 - 145 mmol/L   Potassium 3.0 (L) 3.5 - 5.1 mmol/L   Chloride 102 98 - 111 mmol/L   CO2 25 22 - 32 mmol/L   Glucose, Bld 135 (H) 70 - 99 mg/dL   BUN 14 6 - 20 mg/dL   Creatinine, Ser 0.79 0.44 - 1.00 mg/dL   Calcium 10.7 (H) 8.9 - 10.3 mg/dL   Total Protein 9.5 (H) 6.5 - 8.1 g/dL   Albumin 5.6 (H) 3.5 - 5.0 g/dL   AST 133 (H) 15 - 41 U/L   ALT 114 (H) 0 - 44 U/L   Alkaline Phosphatase 58 38 - 126 U/L   Total Bilirubin 0.7 0.3 - 1.2 mg/dL   GFR calc  non Af Amer >60 >60 mL/min   GFR calc Af Amer >60 >60 mL/min    Comment: (NOTE) The eGFR has been calculated using the CKD EPI equation. This calculation has not been validated in all clinical situations. eGFR's persistently <60 mL/min signify possible Chronic Kidney Disease.    Anion gap 15 5 - 15    Comment: Performed at [facility], [place] [address], [place], [place] [address]  CBC     Status: Abnormal   Collection Time: 06/11/18  1:53 PM  Result Value Ref Range   WBC 6.4 4.0 - 10.5 K/uL   RBC 4.76 3.87 - 5.11 MIL/uL   Hemoglobin 15.6 (H) 12.0 - 15.0 g/dL   HCT 45.0 36.0 - 46.0 %   MCV 94.5 78.0 - 100.0 fL   MCH 32.8 26.0 - 34.0 pg   MCHC 34.7 30.0 - 36.0 g/dL   RDW 13.1 11.5 - 15.5 %   Platelets 362 150 - 400 K/uL    Comment: Performed at [facility], [place] [address], [place], [place] [address]  Type and screen [place]     Status: None  Collection Time: 06/11/18  1:53 PM  Result Value Ref Range   ABO/RH(D) O POS    Antibody Screen NEG    Sample Expiration      06/14/2018 Performed at [facility], [place] [address], [place], [place] [address]   Ethanol     Status: None   Collection Time: 06/11/18  1:53 PM  Result Value Ref Range   Alcohol, Ethyl (B) <10 <10 mg/dL    Comment: (NOTE) Lowest detectable limit for serum alcohol is 10 mg/dL. For medical purposes only. Performed at [facility], [place] [address], [place], [place] [address]   Salicylate level     Status: None   Collection Time: 06/11/18  1:53 PM  Result Value Ref Range   Salicylate Lvl <3.6 2.8 - 30.0 mg/dL    Comment: Performed at [facility], [place] [address] [place], [place] [address]  Acetaminophen level     Status: Abnormal   Collection Time: 06/11/18  1:53 PM  Result Value Ref Range   Acetaminophen (Tylenol), Serum 41 (H) 10 - 30 ug/mL    Comment: (NOTE) Therapeutic concentrations vary  significantly. A range of 10-30 ug/mL  may be an effective concentration for many patients. However, some  are best treated at concentrations outside of this range. Acetaminophen concentrations >150 ug/mL at 4 hours after ingestion  and >50 ug/mL at 12 hours after ingestion are often associated with  toxic reactions. Performed at [facility], [place] [address] [place], [place] [address]   Protime-INR     Status: None   Collection Time: 06/11/18  1:53 PM  Result Value Ref Range   Prothrombin Time 13.9 11.4 - 15.2 seconds   INR 1.07     Comment: Performed at [facility], [place] [address], [place], [place] [address]  APTT     Status: None   Collection Time: 06/11/18  1:53 PM  Result Value Ref Range   aPTT 30 24 - 36 seconds    Comment: Performed at [facility], [place] [address], [place], [place] [address]  ABO/Rh     Status: None   Collection Time: 06/11/18  1:53 PM  Result Value Ref Range   ABO/RH(D)      O POS Performed at [facility], [place] [address], [place], [place] [address]   CBG monitoring, ED     Status: Abnormal   Collection Time: 06/11/18  2:02 PM  Result Value Ref Range   Glucose-Capillary 131 (H) 70 - 99 mg/dL  I-Stat beta hCG blood, ED     Status: None   Collection Time: 06/11/18  2:12 PM  Result Value Ref Range   I-stat hCG, quantitative <5.0 <5 mIU/mL   Comment 3            Comment:   GEST. AGE      CONC.  (mIU/mL)   <=1 WEEK        5 - 50     2 WEEKS       50 - 500     3 WEEKS       100 - 10,000     4 WEEKS     1,000 - 30,000        FEMALE AND NON-PREGNANT FEMALE:     LESS THAN 5 mIU/mL   Rapid urine drug screen (hospital performed)     Status: Abnormal   Collection Time: 06/11/18  2:21 PM  Result  Value Ref Range   Opiates NONE DETECTED NONE DETECTED   Cocaine NONE DETECTED NONE DETECTED   Benzodiazepines NONE DETECTED NONE DETECTED   Amphetamines NONE DETECTED NONE DETECTED    Tetrahydrocannabinol POSITIVE (A) NONE DETECTED   Barbiturates NONE DETECTED NONE DETECTED    Comment: (NOTE) DRUG SCREEN FOR MEDICAL PURPOSES ONLY.  IF CONFIRMATION IS NEEDED FOR ANY PURPOSE, NOTIFY LAB WITHIN 5 DAYS. LOWEST DETECTABLE LIMITS FOR URINE DRUG SCREEN Drug Class                     Cutoff (ng/mL) Amphetamine and metabolites    1000 Barbiturate and metabolites    200 Benzodiazepine                 270 Tricyclics and metabolites     300 Opiates and metabolites        300 Cocaine and metabolites        300 THC                            50 Performed at [facility], [place] [address] [place], [place] [address]   POC occult blood, ED     Status: None   Collection Time: 06/11/18  2:36 PM  Result Value Ref Range   Fecal Occult Bld NEGATIVE NEGATIVE  Occult bld gastric/duodenum (cup to lab)     Status: None   Collection Time: 06/11/18  4:30 PM  Result Value Ref Range   pH, Gastric 2    Occult Blood, Gastric NEGATIVE NEGATIVE    Comment: Performed at [facility], [place] [address], [place], [place] [address]   No results found.  Pending Labs Unresulted Labs (From admission, onward)    Start     Ordered   06/12/18 1330  Acetaminophen level  Once-Timed,   R     06/11/18 1627   06/12/18 1330  Comprehensive metabolic panel  Once-Timed,   R     06/11/18 1627   06/12/18 1330  Protime-INR  Once-Timed,   R     06/11/18 1627   06/11/18 1712  Urinalysis, Routine w reflex microscopic  Add-on,   R     06/11/18 1711   06/11/18 1659  Magnesium  Add-on,   R     06/11/18 1658          Vitals/Pain Today's Vitals   06/11/18 1630 06/11/18 1645 06/11/18 1700 06/11/18 1715  BP: 127/78 123/80 123/78 122/79  Pulse: 64 64 60 61  Resp: _0 Temp:      TempSrc:      SpO2: 100% 100% 100% 100%  Weight:      Height:      PainSc:        Isolation Precautions No active isolations  Medications Medications  pantoprazole (PROTONIX)  80 mg in sodium chloride 0.9 % 250 mL (0.32 mg/mL) infusion (0 mg/hr Intravenous Stopped 06/11/18 1540)  acetylcysteine (ACETADOTE) 40 mg/mL load via infusion 8,505 mg (8,505 mg Intravenous Bolus from Bag 06/11/18 1535)    Followed by  acetylcysteine (ACETADOTE) 30,000 mg in dextrose 5 % 750 mL (40 mg/mL) infusion (15 mg/kg/hr  56.7 kg Intravenous Rate/Dose Verify 06/11/18 1658)  pantoprazole (PROTONIX) 80 mg in sodium chloride 0.9 % 100 mL IVPB ( Intravenous Paused 06/11/18 1503)  ondansetron (ZOFRAN) injection 4 mg (4 mg Intravenous Given 06/11/18 1422)  sodium chloride 0.9 % bolus 500  mL (0 mLs Intravenous Stopped 06/11/18 1657)  promethazine (PHENERGAN) injection 6.25 mg (6.25 mg Intravenous Given 06/11/18 1626)  potassium chloride SA (K-DUR,KLOR-CON) CR tablet 40 mEq (40 mEq Oral Given 06/11/18 1739)    Mobility Walks

## 2018-06-12 ENCOUNTER — Encounter (HOSPITAL_COMMUNITY): Payer: Self-pay | Admitting: *Deleted

## 2018-06-12 DIAGNOSIS — F17210 Nicotine dependence, cigarettes, uncomplicated: Secondary | ICD-10-CM

## 2018-06-12 DIAGNOSIS — F329 Major depressive disorder, single episode, unspecified: Secondary | ICD-10-CM

## 2018-06-12 DIAGNOSIS — K920 Hematemesis: Secondary | ICD-10-CM

## 2018-06-12 DIAGNOSIS — T391X2A Poisoning by 4-Aminophenol derivatives, intentional self-harm, initial encounter: Secondary | ICD-10-CM

## 2018-06-12 DIAGNOSIS — F332 Major depressive disorder, recurrent severe without psychotic features: Principal | ICD-10-CM

## 2018-06-12 DIAGNOSIS — T1491XA Suicide attempt, initial encounter: Secondary | ICD-10-CM

## 2018-06-12 DIAGNOSIS — R740 Nonspecific elevation of levels of transaminase and lactic acid dehydrogenase [LDH]: Secondary | ICD-10-CM

## 2018-06-12 DIAGNOSIS — T391X2D Poisoning by 4-Aminophenol derivatives, intentional self-harm, subsequent encounter: Secondary | ICD-10-CM

## 2018-06-12 DIAGNOSIS — T391X1A Poisoning by 4-Aminophenol derivatives, accidental (unintentional), initial encounter: Secondary | ICD-10-CM

## 2018-06-12 LAB — COMPREHENSIVE METABOLIC PANEL
ALBUMIN: 3.7 g/dL (ref 3.5–5.0)
ALT: 314 U/L — ABNORMAL HIGH (ref 0–44)
ALT: 760 U/L — ABNORMAL HIGH (ref 0–44)
ANION GAP: 9 (ref 5–15)
AST: 401 U/L — ABNORMAL HIGH (ref 15–41)
AST: 1047 U/L — AB (ref 15–41)
Albumin: 3.6 g/dL (ref 3.5–5.0)
Alkaline Phosphatase: 39 U/L (ref 38–126)
Alkaline Phosphatase: 43 U/L (ref 38–126)
Anion gap: 10 (ref 5–15)
BILIRUBIN TOTAL: 0.8 mg/dL (ref 0.3–1.2)
BUN: 7 mg/dL (ref 6–20)
BUN: 5 mg/dL — AB (ref 6–20)
CALCIUM: 9.1 mg/dL (ref 8.9–10.3)
CHLORIDE: 107 mmol/L (ref 98–111)
CO2: 23 mmol/L (ref 22–32)
CO2: 23 mmol/L (ref 22–32)
CREATININE: 0.64 mg/dL (ref 0.44–1.00)
CREATININE: 0.61 mg/dL (ref 0.44–1.00)
Calcium: 8.9 mg/dL (ref 8.9–10.3)
Chloride: 108 mmol/L (ref 98–111)
GFR calc Af Amer: >60 mL/min (ref >60)
GFR calc non Af Amer: >60 mL/min (ref >60)
GLUCOSE: 97 mg/dL (ref 70–99)
Glucose, Bld: 96 mg/dL (ref 70–99)
POTASSIUM: 3.6 mmol/L (ref 3.5–5.1)
POTASSIUM: 3.7 mmol/L (ref 3.5–5.1)
Sodium: 139 mmol/L (ref 135–145)
Sodium: 141 mmol/L (ref 135–145)
TOTAL PROTEIN: 6.5 g/dL (ref 6.5–8.1)
Total Bilirubin: 0.9 mg/dL (ref 0.3–1.2)
Total Protein: 6.4 g/dL — ABNORMAL LOW (ref 6.5–8.1)

## 2018-06-12 LAB — ACETAMINOPHEN LEVEL: Acetaminophen (Tylenol), Serum: <10 ug/mL — ABNORMAL LOW (ref 10–30)

## 2018-06-12 LAB — PROTIME-INR
INR: 1.37
INR: 1.27
INR: 1.14
PROTHROMBIN TIME: 16.7 s — AB (ref 11.4–15.2)
PROTHROMBIN TIME: 14.5 s (ref 11.4–15.2)
Prothrombin Time: 15.8 seconds — ABNORMAL HIGH (ref 11.4–15.2)

## 2018-06-12 LAB — HIV ANTIBODY (ROUTINE TESTING W REFLEX): HIV SCREEN 4TH GENERATION: NONREACTIVE

## 2018-06-12 LAB — CBC
HEMATOCRIT: 40.5 % (ref 36.0–46.0)
HEMOGLOBIN: 13.6 g/dL (ref 12.0–15.0)
MCH: 32.5 pg (ref 26.0–34.0)
MCHC: 33.6 g/dL (ref 30.0–36.0)
MCV: 96.9 fL (ref 78.0–100.0)
PLATELETS: 251 10*3/uL (ref 150–400)
RBC: 4.18 MIL/uL (ref 3.87–5.11)
RDW: 13.3 % (ref 11.5–15.5)
WBC: 7.6 10*3/uL (ref 4.0–10.5)

## 2018-06-12 LAB — APTT: APTT: 33 s (ref 24–36)

## 2018-06-12 MED ORDER — PANTOPRAZOLE SODIUM 40 MG PO TBEC: 40.0000 mg | DELAYED_RELEASE_TABLET | Freq: Two times a day (BID) | ORAL | Status: DC

## 2018-06-12 MED ORDER — PANTOPRAZOLE SODIUM 40 MG PO TBEC
40.0000 mg | DELAYED_RELEASE_TABLET | Freq: Every day | ORAL | Status: DC
  Administered 2018-06-13 – 2018-06-15 (×3): 40 mg via ORAL
  Filled 2018-06-12 (×3): qty 1

## 2018-06-12 NOTE — Progress Notes (Signed)
PROGRESS NOTE    Tammy Lowe  HUT:[id number] DOB: [birth year] DOA: 06/11/2018 PCP: Patient, No Pcp Per    Brief Narrative:  22 year old female history of significant of depression, prior suicidal attempts in the past presenting to the ED after overdose on Tylenol with nausea vomiting and hematemesis.  Tylenol level noted to be elevated.  Transaminitis elevated.  Patient admitted for Tylenol overdose and placed on N-acetylcysteine.  Poison control contacted.  Worsening LFTs and as such GI consulted.   Assessment & Plan:   Principal Problem:   Tylenol overdose Active Problems:   Overdose   Suicide attempt (HCC)   Transaminitis   Hypokalemia   Depression   Dehydration   Hematemesis/vomiting blood   Alcohol use  #1 Tylenol overdose Patient had presented with Tylenol overdose with suicidal ideation.  Noted to have elevated transaminitis on admission.  EKG with short PR interval on admission as well as normal QTC.  LFTs trending up.  INR slowly trending up however currently at 1.37 from 1.07 on admission.  Patient currently on N-acetylcysteine which we will continue for now.  Tylenol level on admission was elevated at 41 now less than 10.  Due to rising transaminitis and INR we will continue N-acetylcysteine and repeat LFTs, INR and Tylenol level 06/13/2018 at 1:30 PM.  Due to rising transaminitis will consult with GI for further evaluation and management.  Psych consultation pending.  2.  Suicidal ideation/suicide attempt See problem #1.  Psych consultation pending.  3.  Hypokalemia Likely secondary to GI losses.  Repleted.  Magnesium at 2.3.  4.  Dehydration IV fluids.  5.  Alcohol use Patient with no signs of withdrawal.  Continue the Ativan withdrawal protocol.  6.  Hematemesis Likely secondary to a Mallory-Weiss tear versus gastritis.  Hemoglobin at 13.6 from 15.6 on admission.  Decreasing hemoglobin likely dilutional in nature.  Continue PPI.  Diet has been advanced to  regular diet.  GI consulted.  7.  Transaminitis Likely secondary to problem #1.  Acute hepatitis panel pending.  Abdominal ultrasound has been discontinued per general surgery.  Continue an acetylcysteine.  Repeat labs in the morning.    DVT prophylaxis: SCDs Code Status: Full Family Communication: Updated patient.  No family present. Disposition Plan: To be determined   Consultants:   Gastroenterology: Dr. Myrtie Neither III 06/12/2018  Psychiatry pending  Procedures:   Chest x-ray 06/11/2018    Antimicrobials:   None   Subjective: Patient laying in bed.  Denies any further nausea or emesis or hematemesis.  Denies any abdominal pain.  No chest pain or shortness of breath.  Objective: Vitals:   06/11/18 1745 06/11/18 1815 06/11/18 2100 06/12/18 0500  BP: (!) 142/98 123/82 117/76 118/90  Pulse: 81 64 74 (!) 50  Resp: 16 16 16 16   Temp:   99.6 F (37.6 C) 98.9 F (37.2 C)  TempSrc:   Oral Oral  SpO2: 100% 100% 100% 100%  Weight:      Height:        Intake/Output Summary (Last 24 hours) at 06/12/2018 1113 Last data filed at 06/12/2018 0600 Gross per 24 hour  Intake 1555.97 ml  Output -  Net 1555.97 ml   Filed Weights   06/11/18 1351  Weight: 56.7 kg    Examination:  General exam: Appears calm and comfortable  Respiratory system: Clear to auscultation. Respiratory effort normal. Cardiovascular system: S1 & S2 heard, RRR. No JVD, murmurs, rubs, gallops or clicks. No pedal edema. Gastrointestinal system: Abdomen is nondistended,  soft and nontender. No organomegaly or masses felt. Normal bowel sounds heard. Central nervous system: Alert and oriented. No focal neurological deficits. Extremities: Symmetric 5 x 5 power. Skin: No rashes, lesions or ulcers Psychiatry: Judgement and insight appear normal. Mood & affect appropriate.     Data Reviewed: I have personally reviewed following labs and imaging studies  CBC: Recent Labs  Lab 06/11/18 1353 06/11/18 1852  06/12/18 0553  WBC 6.4  --  7.6  HGB 15.6* 14.7 13.6  HCT 45.0 43.2 40.5  MCV 94.5  --  96.9  PLT 362  --  251   Basic Metabolic Panel: Recent Labs  Lab 06/11/18 1353 06/12/18 0553  NA 142 139  K 3.0* 3.6  CL 102 107  CO2 25 23  GLUCOSE 135* 96  BUN 14 7  CREATININE 0.79 0.64  CALCIUM 10.7* 8.9  MG 2.3  --    GFR: Estimated Creatinine Clearance: 98.7 mL/min (by C-G formula based on SCr of 0.64 mg/dL). Liver Function Tests: Recent Labs  Lab 06/11/18 1353 06/12/18 0553  AST 133* 401*  ALT 114* 314*  ALKPHOS 58 39  BILITOT 0.7 0.9  PROT 9.5* 6.4*  ALBUMIN 5.6* 3.6   No results for input(s): LIPASE, AMYLASE in the last 168 hours. No results for input(s): AMMONIA in the last 168 hours. Coagulation Profile: Recent Labs  Lab 06/11/18 1353 06/12/18 0553  INR 1.07 1.37   Cardiac Enzymes: No results for input(s): CKTOTAL, CKMB, CKMBINDEX, TROPONINI in the last 168 hours. BNP (last 3 results) No results for input(s): PROBNP in the last 8760 hours. HbA1C: No results for input(s): HGBA1C in the last 72 hours. CBG: Recent Labs  Lab 06/11/18 1402  GLUCAP 131*   Lipid Profile: No results for input(s): CHOL, HDL, LDLCALC, TRIG, CHOLHDL, LDLDIRECT in the last 72 hours. Thyroid Function Tests: No results for input(s): TSH, T4TOTAL, FREET4, T3FREE, THYROIDAB in the last 72 hours. Anemia Panel: No results for input(s): VITAMINB12, FOLATE, FERRITIN, TIBC, IRON, RETICCTPCT in the last 72 hours. Sepsis Labs: No results for input(s): PROCALCITON, LATICACIDVEN in the last 168 hours.  No results found for this or any previous visit (from the past 240 hour(s)).       Radiology Studies: Dg Chest 2 View  Result Date: 06/11/2018 CLINICAL DATA:  Per Pt: Pt reports she took half a bottle of 500mg  acetaminophen tablets. Pt reports it was a suicide attempt, vomiting today, pt denies any chest complaints. Smoker. EXAM: CHEST - 2 VIEW COMPARISON:  03/06/2016 FINDINGS: The heart  size and mediastinal contours are within normal limits. Both lungs are clear. The visualized skeletal structures are unremarkable. IMPRESSION: No active cardiopulmonary disease. Electronically Signed   By: Norva Pavlov M.D.   On: 06/11/2018 18:11        Scheduled Meds: . folic acid  1 mg Oral Daily  . loratadine  10 mg Oral Daily  . multivitamin with minerals  1 tablet Oral Daily  . norgestrel-ethinyl estradiol  1 tablet Oral Daily  . pantoprazole (PROTONIX) IV  40 mg Intravenous Q12H  . thiamine  100 mg Oral Daily   Or  . thiamine  100 mg Intravenous Daily   Continuous Infusions: . sodium chloride 125 mL/hr at 06/12/18 0441  . acetylcysteine 15 mg/kg/hr (06/12/18 0600)     LOS: 1 day    Time spent: 35 mins    Ramiro Harvest, MD [facility] Pager [phone]  If 7PM-7AM, please contact night-coverage [email or web address] Password [email or web address] 06/12/2018, 11:13  AM  

## 2018-06-12 NOTE — Consult Note (Signed)
Consultation  Referring Provider: Dr. Grandville Lowe    Primary Care Physician:  Patient, No Pcp Per Primary Gastroenterologist: Althia Forts        Reason for Consultation: Tylenol overdose, Elevated LFTs , Hematemesis           HPI:   Tammy Lowe is a 22 y.o. female with a past medical history of depression who arrived in the ED yesterday after excessive Tylenol ingestion, hematemesis and suicidal ideation.    Per chart review it appears patient has had prior suicidal attempts in the past one when she was 22 years old.      Today, patient admits to taking over a half a bottle of 500 mg Tylenol tablets (she thinks more than 25 tabs) on Wednesday night, 06/10/2018 around 10 PM.  She also drinks 6 small airplane bottles of fireball in an attempt to commit suicide.  Then explains that she woke up in the middle the night with several bouts of nausea and greater than 10 episodes of vomiting streaked with dark blood.  Proceeded to the ER and continued to vomit but tells me she was no longer seeing any blood, the last time she vomited was around 3 PM yesterday.  Currently feels well other than having a "sore throat".  Denies any continued abdominal pain.  She has not had a bowel movement since admission.    Denies fever, chills, dizziness or symptoms that awaken her from sleep.  ED course: CMP with potassium 3.0, AST 133, ALT of 114; CBC with a hemoglobin of 15.6, INR 1.07, PTT was 30, PT was 13.9, acetaminophen level was 41, salicylate level is less than 7, fecal occult blood negative; UDS is positive for THC; EKG was short PR interval with T wave inversion; she was started on Mucomyst in the ED  Past Medical History:  Diagnosis Date  . Depression 06/11/2018    Past Surgical History:  Procedure Laterality Date  . APPENDECTOMY      Family history: No family history of IBD or colon cancer  Social History   Tobacco Use  . Smoking status: Current Some Day Smoker    Packs/day: 0.25    Years:  0.00    Pack years: 0.00    Types: Cigarettes  . Smokeless tobacco: Never Used  Substance Use Topics  . Alcohol use: Yes    Comment: occa  . Drug use: Yes    Types: Marijuana    Prior to Admission medications   Medication Sig Start Date End Date Taking? Authorizing Provider  loratadine (CLARITIN) 10 MG tablet Take 10 mg by mouth daily.   Yes [provider]  norgestrel-ethinyl estradiol (LO/OVRAL,CRYSELLE) 0.3-30 MG-MCG tablet Take 1 tablet by mouth daily.   Yes [provider]  amoxicillin (AMOXIL) 500 MG capsule Take 1 capsule (500 mg total) by mouth 3 (three) times daily. Patient not taking: Reported on 03/06/2016 06/03/15   Ashley Murrain, NP  ibuprofen (ADVIL,MOTRIN) 800 MG tablet Take 1 tablet (800 mg total) by mouth 3 (three) times daily. Patient not taking: Reported on 06/11/2018 12/03/16   Ward, Ozella Almond, PA-C  naproxen (NAPROSYN) 500 MG tablet Take 1 tablet (500 mg total) by mouth 2 (two) times daily. Patient not taking: Reported on 06/11/2018 09/02/16   Varney Biles, MD  ondansetron (ZOFRAN ODT) 8 MG disintegrating tablet Take 1 tablet (8 mg total) by mouth every 8 (eight) hours as needed for nausea or vomiting. Patient not taking: Reported on 03/06/2016 11/26/14  Pattricia Boss, MD  orphenadrine (NORFLEX) 100 MG tablet Take 1 tablet (100 mg total) by mouth 2 (two) times daily. Patient not taking: Reported on 06/11/2018 03/06/16   Charlesetta Shanks, MD    Current Facility-Administered Medications  Medication Dose Route Frequency Provider Last Rate Last Dose  . 0.9 %  sodium chloride infusion   Intravenous Continuous Eugenie Filler, MD 125 mL/hr at 06/12/18 0441    . acetylcysteine (ACETADOTE) 30,000 mg in dextrose 5 % 750 mL (40 mg/mL) infusion  15 mg/kg/hr Intravenous Continuous Eugenie Filler, MD 21.3 mL/hr at 06/12/18 0600 15 mg/kg/hr at 06/12/18 0600  . folic acid (FOLVITE) tablet 1 mg  1 mg Oral Daily Eugenie Filler, MD   1 mg at 06/11/18 2041  .  Influenza vac split quadrivalent PF (FLUARIX) injection 0.5 mL  0.5 mL Intramuscular Tomorrow-1000 Eugenie Filler, MD      . loratadine (CLARITIN) tablet 10 mg  10 mg Oral Daily Eugenie Filler, MD   10 mg at 06/11/18 2041  . LORazepam (ATIVAN) tablet 1 mg  1 mg Oral Q6H PRN Eugenie Filler, MD       Or  . LORazepam (ATIVAN) injection 1 mg  1 mg Intravenous Q6H PRN Eugenie Filler, MD      . multivitamin with minerals tablet 1 tablet  1 tablet Oral Daily Eugenie Filler, MD   1 tablet at 06/11/18 2041  . norgestrel-ethinyl estradiol (LO/OVRAL,CRYSELLE) 0.3-30 MG-MCG per tablet 1 tablet  1 tablet Oral Daily Eugenie Filler, MD      . pantoprazole (PROTONIX) injection 40 mg  40 mg Intravenous Q12H Eugenie Filler, MD   40 mg at 06/11/18 2055  . promethazine (PHENERGAN) tablet 12.5 mg  12.5 mg Oral Q6H PRN Eugenie Filler, MD      . senna-docusate (Senokot-S) tablet 1 tablet  1 tablet Oral QHS PRN Eugenie Filler, MD      . sodium phosphate (FLEET) 7-19 GM/118ML enema 1 enema  1 enema Rectal Once PRN Eugenie Filler, MD      . sorbitol 70 % solution 30 mL  30 mL Oral Daily PRN Eugenie Filler, MD      . thiamine (VITAMIN B-1) tablet 100 mg  100 mg Oral Daily Eugenie Filler, MD   100 mg at 06/11/18 2041   Or  . thiamine (B-1) injection 100 mg  100 mg Intravenous Daily Eugenie Filler, MD      . traMADol Veatrice Bourbon) tablet 50 mg  50 mg Oral Q6H PRN Eugenie Filler, MD        Allergies as of 06/11/2018  . (No Known Allergies)     Review of Systems:    Constitutional: No weight loss, fever or chills Skin: No rash  Cardiovascular: No chest pain Respiratory: No SOB Gastrointestinal: See HPI and otherwise negative Genitourinary: No dysuria Neurological: No headache Musculoskeletal: No new muscle or joint pain Hematologic: No bruising Psychiatric: +depression, suicidal ideation   Physical Exam:  Vital signs in last 24 hours: Temp:  [98.7 F (37.1  C)-99.6 F (37.6 C)] 98.9 F (37.2 C) (09/06 0500) Pulse Rate:  [45-133] 50 (09/06 0500) Resp:  [12-29] 16 (09/06 0500) BP: (109-147)/(71-98) 118/90 (09/06 0500) SpO2:  [99 %-100 %] 100 % (09/06 0500) Weight:  [56.7 kg] 56.7 kg (09/05 1351) Last BM Date: 06/10/18 General:   Pleasant African-American female appears to be in NAD, Well developed, Well nourished, alert and cooperative  Head:  Normocephalic and atraumatic. Eyes:   PEERL, EOMI. No icterus. Conjunctiva pink. Ears:  Normal auditory acuity. Neck:  Supple Throat: Oral cavity and pharynx without inflammation, swelling or lesion.  Lungs: Respirations even and unlabored. Lungs clear to auscultation bilaterally.   No wheezes, crackles, or rhonchi.  Heart: Normal S1, S2. No MRG. Regular rate and rhythm. No peripheral edema, cyanosis or pallor.  Abdomen:  Soft, nondistended, nontender. No rebound or guarding. Normal bowel sounds. No appreciable masses or hepatomegaly. Rectal:  Not performed.  Msk:  Symmetrical without gross deformities. Peripheral pulses intact.  Extremities:  Without edema, no deformity or joint abnormality.  Neurologic:  Alert and  oriented x4;  grossly normal neurologically.  Skin:   Dry and intact without significant lesions or rashes. Psychiatric: Demonstrates good judgement and reason without abnormal affect or behaviors.   LAB RESULTS: Recent Labs    06/11/18 1353 06/11/18 1852 06/12/18 0553  WBC 6.4  --  7.6  HGB 15.6* 14.7 13.6  HCT 45.0 43.2 40.5  PLT 362  --  251   BMET Recent Labs    06/11/18 1353 06/12/18 0553  NA 142 139  K 3.0* 3.6  CL 102 107  CO2 25 23  GLUCOSE 135* 96  BUN 14 7  CREATININE 0.79 0.64  CALCIUM 10.7* 8.9   Hepatic Function Latest Ref Rng & Units 06/12/2018 06/11/2018  Total Protein 6.5 - 8.1 g/dL 6.4(L) 9.5(H)  Albumin 3.5 - 5.0 g/dL 3.6 5.6(H)  AST 15 - 41 U/L 401(H) 133(H)  ALT 0 - 44 U/L 314(H) 114(H)  Alk Phosphatase 38 - 126 U/L 39 58  Total Bilirubin 0.3 -  1.2 mg/dL 0.9 0.7    PT/INR Recent Labs    06/11/18 1353 06/12/18 0553  LABPROT 13.9 16.7*  INR 1.07 1.37    STUDIES: Dg Chest 2 View  Result Date: 06/11/2018 CLINICAL DATA:  Per Pt: Pt reports she took half a bottle of 556m acetaminophen tablets. Pt reports it was a suicide attempt, vomiting today, pt denies any chest complaints. Smoker. EXAM: CHEST - 2 VIEW COMPARISON:  03/06/2016 FINDINGS: The heart size and mediastinal contours are within normal limits. Both lungs are clear. The visualized skeletal structures are unremarkable. IMPRESSION: No active cardiopulmonary disease. Electronically Signed   By: ENolon NationsM.D.   On: 06/11/2018 18:11    Impression / Plan:   Impression: 1.  Tylenol overdose/suicidal ideation: Greater than 25 tablets of 500 mg Tylenol taken 06/10/2018 at 10 PM, initially AST 133, ALT 114, increased to AST 401 and ALT 314 overnight, patient did receive acetylcysteine at time of arrival; it can be completely normal and take up to 72-96 hours after Tylenol ingestion for LFTs to peak, will need to continue to watch INR every 12 hours for now 2.  Hematemesis: Few episodes of dark blood streaking vomit, consider relation to gastritis from alcohol and Tylenol, none since 3 PM 06/11/2018 3.  Transaminitis: Related to Tylenol overdose  Plan: 1.  Per Dr. DLoletha Carrowdiscontinued ultrasound order.  Elevated LFTs are still very much related to Tylenol overdose.  Again it can take 72-96 hours after ingestion for LFTs to peak. 2.  Will order INR every 12 hours for now 3.  Ordered regular diet for the patient 4. Agree with Protonix [id number] 5.  Continue to monitor LFTs 6.  Please await any further recommendations from Dr. DaLoletha Carrowater today  Thank you for your kind consultation, we will continue to follow.  JeAnderson Malta  Tasia Catchings  06/12/2018, 8:39 AM

## 2018-06-12 NOTE — Progress Notes (Signed)
PHARMACIST - PHYSICIAN COMMUNICATION  CONCERNING: IV to Oral Route Change Policy  RECOMMENDATION: This patient is receiving pantoprazole by the intravenous route.  Based on criteria approved by the Pharmacy and Therapeutics Committee, the intravenous medication(s) is/are being converted to the equivalent oral dose form(s).   DESCRIPTION: These criteria include:  The patient is eating (either orally or via tube) and/or has been taking other orally administered medications for a least 24 hours  The patient has no evidence of active gastrointestinal bleeding or impaired GI absorption (gastrectomy, short bowel, patient on TNA or NPO).  If you have questions about this conversion, please contact the Pharmacy Department  []   [phone] )  [facility] []   [phone] )  [facility] []   [phone] )  [facility] []   [phone] )  [facility] [x]   [phone] )  [facility]   Ivery Quale, [place] 06/12/2018 12:05 PM

## 2018-06-12 NOTE — Consult Note (Signed)
[facility] Face-to-Face Psychiatry Consult   Reason for Consult:  Intentional overdose Referring Physician:  Dr Grandville Silos Patient Identification: Tammy Lowe MRN:  [id number] Principal Diagnosis: Tylenol overdose Diagnosis:   Patient Active Problem List   Diagnosis Date Noted  . MDD (major depressive disorder), recurrent severe, without psychosis ([place]) [F33.2] 06/12/2018    Priority: High  . Alcohol use [Z78.9] 06/11/2018    Priority: High  . Overdose [T50.901A] 06/11/2018  . Tylenol overdose [T39.1X1A] 06/11/2018  . Suicide attempt ([place]) [T14.91XA] 06/11/2018  . Transaminitis [R74.0] 06/11/2018  . Hypokalemia [E87.6] 06/11/2018  . Depression [F32.9] 06/11/2018  . Dehydration [E86.0] 06/11/2018  . Hematemesis/vomiting blood [K92.0] 06/11/2018  . Suicidal ideation [R45.851]     Total Time spent with patient: 45 minutes  Subjective:   Tammy Lowe is a 22 y.o. female patient needs a psychiatric admission.  Please refer to [facility] [phone]) when medically cleared.  Dr. Dwyane Dee reviewed this patient and concurs with the plan.  HPI:  22 yo female who came to the ED after overdosing on acetaminophen in a suicide attempt.  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  Stress makes her depression worse.  No drug use except cannabis.  Denies anxiety, hallucinations, and homicidal ideations.  Minimal support system:  Mother and sister live in [place], brother in [place].  Reports they all have depression and anxiety.    Past Psychiatric History: depression  Risk to Self:  yes Risk to Others:  no Prior Inpatient Therapy:  no Prior Outpatient Therapy:  psychiatrist years ago, Dr Creig Hines  Past Medical History:  Past Medical History:  Diagnosis Date  . Depression 06/11/2018    Past  Surgical History:  Procedure Laterality Date  . APPENDECTOMY     Family History: No family history on file. Family Psychiatric  History: depression and anxiety--mother, brother, sister Social History:  Social History   Substance and Sexual Activity  Alcohol Use Yes   Comment: occa     Social History   Substance and Sexual Activity  Drug Use Yes  . Types: Marijuana    Social History   Socioeconomic History  . Marital status: Single    Spouse name: Not on file  . Number of children: Not on file  . Years of education: Not on file  . Highest education level: Not on file  Occupational History  . Not on file  Social Needs  . Financial resource strain: Not on file  . Food insecurity:    Worry: Not on file    Inability: Not on file  . Transportation needs:    Medical: Not on file    Non-medical: Not on file  Tobacco Use  . Smoking status: Current Some Day Smoker    Packs/day: 0.25    Years: 0.00    Pack years: 0.00    Types: Cigarettes  . Smokeless tobacco: Never Used  Substance and Sexual Activity  . Alcohol use: Yes    Comment: occa  . Drug use: Yes    Types: Marijuana  . Sexual activity: Not on file  Lifestyle  . Physical activity:    Days per week: Not on file    Minutes per session: Not on file  . Stress: Not on file  Relationships  . Social connections:  Talks on phone: Not on file    Gets together: Not on file    Attends religious service: Not on file    Active member of club or organization: Not on file    Attends meetings of clubs or organizations: Not on file    Relationship status: Not on file  Other Topics Concern  . Not on file  Social History Narrative  . Not on file   Additional Social History:    Allergies:  No Known Allergies  Labs:  Results for orders placed or performed during the hospital encounter of 06/11/18 (from the past 48 hour(s))  Comprehensive metabolic panel     Status: Abnormal   Collection Time: 06/11/18  1:53 PM   Result Value Ref Range   Sodium 142 135 - 145 mmol/L   Potassium 3.0 (L) 3.5 - 5.1 mmol/L   Chloride 102 98 - 111 mmol/L   CO2 25 22 - 32 mmol/L   Glucose, Bld 135 (H) 70 - 99 mg/dL   BUN 14 6 - 20 mg/dL   Creatinine, Ser 0.79 0.44 - 1.00 mg/dL   Calcium 10.7 (H) 8.9 - 10.3 mg/dL   Total Protein 9.5 (H) 6.5 - 8.1 g/dL   Albumin 5.6 (H) 3.5 - 5.0 g/dL   AST 133 (H) 15 - 41 U/L   ALT 114 (H) 0 - 44 U/L   Alkaline Phosphatase 58 38 - 126 U/L   Total Bilirubin 0.7 0.3 - 1.2 mg/dL   GFR calc non Af Amer >60 >60 mL/min   GFR calc Af Amer >60 >60 mL/min    Comment: (NOTE) The eGFR has been calculated using the CKD EPI equation. This calculation has not been validated in all clinical situations. eGFR's persistently <60 mL/min signify possible Chronic Kidney Disease.    Anion gap 15 5 - 15    Comment: Performed at [facility], [place] [address] [place], [place] [address]  CBC     Status: Abnormal   Collection Time: 06/11/18  1:53 PM  Result Value Ref Range   WBC 6.4 4.0 - 10.5 K/uL   RBC 4.76 3.87 - 5.11 MIL/uL   Hemoglobin 15.6 (H) 12.0 - 15.0 g/dL   HCT 45.0 36.0 - 46.0 %   MCV 94.5 78.0 - 100.0 fL   MCH 32.8 26.0 - 34.0 pg   MCHC 34.7 30.0 - 36.0 g/dL   RDW 13.1 11.5 - 15.5 %   Platelets 362 150 - 400 K/uL    Comment: Performed at [facility], [place] [address], [place], [place] [address]  Type and screen [place]     Status: None   Collection Time: 06/11/18  1:53 PM  Result Value Ref Range   ABO/RH(D) O POS    Antibody Screen NEG    Sample Expiration      06/14/2018 Performed at [facility], [place] [address] [place], [place] [address]   Ethanol     Status: None   Collection Time: 06/11/18  1:53 PM  Result Value Ref Range   Alcohol, Ethyl (B) <10 <10 mg/dL    Comment: (NOTE) Lowest detectable limit for serum alcohol is 10 mg/dL. For medical purposes only. Performed at [facility], [place] [address] [place],  [address]   Salicylate level     Status: None   Collection Time: 06/11/18  1:53 PM  Result Value Ref Range   Salicylate Lvl <7.4 2.8 - 30.0 mg/dL  Comment: Performed at [facility], [place] [address], [place], [place] [address]  Acetaminophen level     Status: Abnormal   Collection Time: 06/11/18  1:53 PM  Result Value Ref Range   Acetaminophen (Tylenol), Serum 41 (H) 10 - 30 ug/mL    Comment: (NOTE) Therapeutic concentrations vary significantly. A range of 10-30 ug/mL  may be an effective concentration for many patients. However, some  are best treated at concentrations outside of this range. Acetaminophen concentrations >150 ug/mL at 4 hours after ingestion  and >50 ug/mL at 12 hours after ingestion are often associated with  toxic reactions. Performed at [facility], [place] [address], [place], [place] [address]   Protime-INR     Status: None   Collection Time: 06/11/18  1:53 PM  Result Value Ref Range   Prothrombin Time 13.9 11.4 - 15.2 seconds   INR 1.07     Comment: Performed at [facility], [place] [address] [place], [place] [address]  APTT     Status: None   Collection Time: 06/11/18  1:53 PM  Result Value Ref Range   aPTT 30 24 - 36 seconds    Comment: Performed at [facility], [place] [address], [place], [place] [address]  ABO/Rh     Status: None   Collection Time: 06/11/18  1:53 PM  Result Value Ref Range   ABO/RH(D)      O POS Performed at [facility], [place] [address], [place], [place] [address]   Magnesium     Status: None   Collection Time: 06/11/18  1:53 PM  Result Value Ref Range   Magnesium 2.3 1.7 - 2.4 mg/dL    Comment: Performed at [facility], [place] [address], [place],  [address]  CBG monitoring, ED     Status: Abnormal   Collection Time: 06/11/18  2:02 PM  Result Value Ref Range    Glucose-Capillary 131 (H) 70 - 99 mg/dL  I-Stat beta hCG blood, ED     Status: None   Collection Time: 06/11/18  2:12 PM  Result Value Ref Range   I-stat hCG, quantitative <5.0 <5 mIU/mL   Comment 3            Comment:   GEST. AGE      CONC.  (mIU/mL)   <=1 WEEK        5 - 50     2 WEEKS       50 - 500     3 WEEKS       100 - 10,000     4 WEEKS     1,000 - 30,000        FEMALE AND NON-PREGNANT FEMALE:     LESS THAN 5 mIU/mL   Rapid urine drug screen (hospital performed)     Status: Abnormal   Collection Time: 06/11/18  2:21 PM  Result Value Ref Range   Opiates NONE DETECTED NONE DETECTED   Cocaine NONE DETECTED NONE DETECTED   Benzodiazepines NONE DETECTED NONE DETECTED   Amphetamines NONE DETECTED NONE DETECTED   Tetrahydrocannabinol POSITIVE (A) NONE DETECTED   Barbiturates NONE DETECTED NONE DETECTED    Comment: (NOTE) DRUG SCREEN FOR MEDICAL PURPOSES ONLY.  IF CONFIRMATION IS NEEDED FOR ANY PURPOSE, NOTIFY LAB WITHIN 5 DAYS. LOWEST DETECTABLE LIMITS FOR URINE DRUG SCREEN Drug Class  Cutoff (ng/mL) Amphetamine and metabolites    1000 Barbiturate and metabolites    200 Benzodiazepine                 967 Tricyclics and metabolites     300 Opiates and metabolites        300 Cocaine and metabolites        300 THC                            50 Performed at [facility], [place] [address] [place], [place] [address]   Urinalysis, Routine w reflex microscopic     Status: Abnormal   Collection Time: 06/11/18  2:21 PM  Result Value Ref Range   Color, Urine AMBER (A) YELLOW    Comment: BIOCHEMICALS MAY BE AFFECTED BY COLOR   APPearance CLEAR CLEAR   Specific Gravity, Urine >1.046 (H) 1.005 - 1.030   pH 5.0 5.0 - 8.0   Glucose, UA NEGATIVE NEGATIVE mg/dL   Hgb urine dipstick NEGATIVE NEGATIVE   Bilirubin Urine SMALL (A) NEGATIVE   Ketones, ur 80 (A) NEGATIVE mg/dL   Protein, ur 100 (A) NEGATIVE mg/dL   Nitrite NEGATIVE NEGATIVE    Leukocytes, UA NEGATIVE NEGATIVE   RBC / HPF 0-5 0 - 5 RBC/hpf   WBC, UA 0-5 0 - 5 WBC/hpf   Bacteria, UA NONE SEEN NONE SEEN   Squamous Epithelial / LPF 0-5 0 - 5   Mucus PRESENT     Comment: Performed at [facility], [place] [address] [place], [place] [address]  POC occult blood, ED     Status: None   Collection Time: 06/11/18  2:36 PM  Result Value Ref Range   Fecal Occult Bld NEGATIVE NEGATIVE  Occult bld gastric/duodenum (cup to lab)     Status: None   Collection Time: 06/11/18  4:30 PM  Result Value Ref Range   pH, Gastric 2    Occult Blood, Gastric NEGATIVE NEGATIVE    Comment: Performed at [facility], [place] [address] [place], [place] [address]  Hemoglobin and hematocrit, blood     Status: None   Collection Time: 06/11/18  6:52 PM  Result Value Ref Range   Hemoglobin 14.7 12.0 - 15.0 g/dL   HCT 43.2 36.0 - 46.0 %    Comment: Performed at [facility], [place] [address] [place], [place] [address]  HIV antibody (Routine Testing)     Status: None   Collection Time: 06/11/18  7:38 PM  Result Value Ref Range   HIV Screen 4th Generation wRfx Non Reactive Non Reactive    Comment: (NOTE) Performed At: [facility] [place], [place] [id number] Rush Farmer [place] RA:[id number]   Protime-INR     Status: Abnormal   Collection Time: 06/12/18  5:53 AM  Result Value Ref Range   Prothrombin Time 16.7 (H) 11.4 - 15.2 seconds   INR 1.37     Comment: Performed at [facility], [place] [address], [place], [place] [address]  APTT     Status: None   Collection Time: 06/12/18  5:53 AM  Result Value Ref Range   aPTT 33 24 - 36 seconds    Comment: Performed at [facility], [place] [address], [place], [place] [address]  Comprehensive metabolic panel     Status: Abnormal   Collection Time: 06/12/18  5:53 AM  Result Value Ref  Range   Sodium 139 135 - 145 mmol/L   Potassium 3.6 3.5 -  5.1 mmol/L   Chloride 107 98 - 111 mmol/L   CO2 23 22 - 32 mmol/L   Glucose, Bld 96 70 - 99 mg/dL   BUN 7 6 - 20 mg/dL   Creatinine, Ser 0.64 0.44 - 1.00 mg/dL   Calcium 8.9 8.9 - 10.3 mg/dL   Total Protein 6.4 (L) 6.5 - 8.1 g/dL   Albumin 3.6 3.5 - 5.0 g/dL   AST 401 (H) 15 - 41 U/L   ALT 314 (H) 0 - 44 U/L   Alkaline Phosphatase 39 38 - 126 U/L   Total Bilirubin 0.9 0.3 - 1.2 mg/dL   GFR calc non Af Amer >60 >60 mL/min   GFR calc Af Amer >60 >60 mL/min    Comment: (NOTE) The eGFR has been calculated using the CKD EPI equation. This calculation has not been validated in all clinical situations. eGFR's persistently <60 mL/min signify possible Chronic Kidney Disease.    Anion gap 9 5 - 15    Comment: Performed at [facility], [place] [address], [place], [place] [address]  CBC     Status: None   Collection Time: 06/12/18  5:53 AM  Result Value Ref Range   WBC 7.6 4.0 - 10.5 K/uL   RBC 4.18 3.87 - 5.11 MIL/uL   Hemoglobin 13.6 12.0 - 15.0 g/dL   HCT 40.5 36.0 - 46.0 %   MCV 96.9 78.0 - 100.0 fL   MCH 32.5 26.0 - 34.0 pg   MCHC 33.6 30.0 - 36.0 g/dL   RDW 13.3 11.5 - 15.5 %   Platelets 251 150 - 400 K/uL    Comment: Performed at [facility], [place] [address], [place], [place] [address]  Protime-INR Once     Status: Abnormal   Collection Time: 06/12/18 10:13 AM  Result Value Ref Range   Prothrombin Time 15.8 (H) 11.4 - 15.2 seconds   INR 1.27     Comment: Performed at [facility], [place] [address], [place], [place] [address]  Acetaminophen level     Status: Abnormal   Collection Time: 06/12/18  1:17 PM  Result Value Ref Range   Acetaminophen (Tylenol), Serum <10 (L) 10 - 30 ug/mL    Comment: (NOTE) Therapeutic concentrations vary significantly. A range of 10-30 ug/mL  may be an effective concentration for many patients. However, some  are best treated at concentrations outside of this range. Acetaminophen concentrations  >150 ug/mL at 4 hours after ingestion  and >50 ug/mL at 12 hours after ingestion are often associated with  toxic reactions. Performed at [facility], [place] [address], [place], [place] [address]   Comprehensive metabolic panel     Status: Abnormal   Collection Time: 06/12/18  1:17 PM  Result Value Ref Range   Sodium 141 135 - 145 mmol/L   Potassium 3.7 3.5 - 5.1 mmol/L   Chloride 108 98 - 111 mmol/L   CO2 23 22 - 32 mmol/L   Glucose, Bld 97 70 - 99 mg/dL   BUN 5 (L) 6 - 20 mg/dL   Creatinine, Ser 0.61 0.44 - 1.00 mg/dL   Calcium 9.1 8.9 - 10.3 mg/dL   Total Protein 6.5 6.5 - 8.1 g/dL   Albumin 3.7 3.5 - 5.0 g/dL   AST 1,047 (H) 15 - 41 U/L   ALT 760 (H) 0 - 44 U/L   Alkaline  Phosphatase 43 38 - 126 U/L   Total Bilirubin 0.8 0.3 - 1.2 mg/dL   GFR calc non Af Amer >60 >60 mL/min   GFR calc Af Amer >60 >60 mL/min    Comment: (NOTE) The eGFR has been calculated using the CKD EPI equation. This calculation has not been validated in all clinical situations. eGFR's persistently <60 mL/min signify possible Chronic Kidney Disease.    Anion gap 10 5 - 15    Comment: Performed at [facility], [place] [address], [place], [place] [address]    Current Facility-Administered Medications  Medication Dose Route Frequency Provider Last Rate Last Dose  . 0.9 %  sodium chloride infusion   Intravenous Continuous Eugenie Filler, MD 100 mL/hr at 06/12/18 1234    . acetylcysteine (ACETADOTE) 30,000 mg in dextrose 5 % 750 mL (40 mg/mL) infusion  15 mg/kg/hr Intravenous Continuous Eugenie Filler, MD 21.3 mL/hr at 06/12/18 0600 15 mg/kg/hr at 06/12/18 0600  . folic acid (FOLVITE) tablet 1 mg  1 mg Oral Daily Eugenie Filler, MD   1 mg at 06/12/18 0956  . loratadine (CLARITIN) tablet 10 mg  10 mg Oral Daily Eugenie Filler, MD   10 mg at 06/12/18 0956  . LORazepam (ATIVAN) tablet 1 mg  1 mg Oral Q6H PRN Eugenie Filler, MD       Or  . LORazepam  (ATIVAN) injection 1 mg  1 mg Intravenous Q6H PRN Eugenie Filler, MD      . multivitamin with minerals tablet 1 tablet  1 tablet Oral Daily Eugenie Filler, MD   1 tablet at 06/12/18 [phone]  . norgestrel-ethinyl estradiol (LO/OVRAL,CRYSELLE) 0.3-30 MG-MCG per tablet 1 tablet  1 tablet Oral Daily Eugenie Filler, MD      . pantoprazole (PROTONIX) EC tablet 40 mg  40 mg Oral BID Germain Osgood A, Student-PharmD      . promethazine (PHENERGAN) tablet 12.5 mg  12.5 mg Oral Q6H PRN Eugenie Filler, MD      . senna-docusate (Senokot-S) tablet 1 tablet  1 tablet Oral QHS PRN Eugenie Filler, MD      . sodium phosphate (FLEET) 7-19 GM/118ML enema 1 enema  1 enema Rectal Once PRN Eugenie Filler, MD      . sorbitol 70 % solution 30 mL  30 mL Oral Daily PRN Eugenie Filler, MD      . thiamine (VITAMIN B-1) tablet 100 mg  100 mg Oral Daily Eugenie Filler, MD   100 mg at 06/12/18 2751   Or  . thiamine (B-1) injection 100 mg  100 mg Intravenous Daily Eugenie Filler, MD      . traMADol Veatrice Bourbon) tablet 50 mg  50 mg Oral Q6H PRN Eugenie Filler, MD        Musculoskeletal: Strength & Muscle Tone: within normal limits Gait & Station: normal Patient leans: N/A  Psychiatric Specialty Exam: Physical Exam  Nursing note and vitals reviewed. Constitutional: She is oriented to person, place, and time. She appears well-developed and well-nourished.  HENT:  Head: Normocephalic.  Neck: Normal range of motion.  Respiratory: Effort normal.  Musculoskeletal: Normal range of motion.  Neurological: She is oriented to person, place, and time.  Psychiatric: Her speech is normal and behavior is normal. Her mood appears anxious. Cognition and memory are normal. She expresses impulsivity. She exhibits a depressed mood. She expresses suicidal ideation. She expresses suicidal plans.    Review of  Systems  Psychiatric/Behavioral: Positive for depression, substance abuse and suicidal ideas. The  patient is nervous/anxious.   All other systems reviewed and are negative.   Blood pressure 113/80, pulse (!) 58, temperature 98.4 F (36.9 C), temperature source Oral, resp. rate 16, height 5' 7"  (1.702 m), weight 56.7 kg, last menstrual period 06/08/2018, SpO2 98 %.Body mass index is 19.58 kg/m.  General Appearance: Casual  Eye Contact:  Fair  Speech:  Normal Rate  Volume:  Normal  Mood:  Anxious and Depressed  Affect:  Congruent  Thought Process:  Coherent and Descriptions of Associations: Intact  Orientation:  Full (Time, Place, and Person)  Thought Content:  Rumination  Suicidal Thoughts:  Yes.  with intent/plan  Homicidal Thoughts:  No  Memory:  Immediate;   Fair Recent;   Fair Remote;   Fair  Judgement:  Poor  Insight:  Fair  Psychomotor Activity:  Decreased  Concentration:  Concentration: Fair and Attention Span: Fair  Recall:  [employer] of Knowledge:  Fair  Language:  Good  Akathisia:  No  Handed:  Right  AIMS (if indicated):     Assets:  Housing Leisure Time Physical Health Resilience Vocational/Educational  ADL's:  Intact  Cognition:  WNL  Sleep:        Treatment Plan Summary: Daily contact with patient to assess and evaluate symptoms and progress in treatment, Medication management and Plan major depressive disorder, recurrent, severe without psychosis:  -Admit to [facility]  -no medications started due to overdose, needs to clear first.  Disposition: Recommend psychiatric Inpatient admission when medically cleared.  Waylan Boga, NP 06/12/2018 2:47 PM

## 2018-06-12 NOTE — Progress Notes (Signed)
Pharmacy: Re- Acetadote  Patient's a 22 y.o F presented to the ED on 9/5 with tylenol overdose.  Acetadote drip started on admission.  - 9/5 at 1353:  tylenol level = 41; INR = 1.07;                         AST/ALT= 133/114; TBili= 0.7         at 1535: Acetadote drip started - 9/6 at 0553:  INR = up 1.37; AST/ALT= up 401/314;                        Tbili= up 0.9         at 1013:  INR= down 1.27         at 1330:  APAP <10; AST/ALT 1047/760                      Spoke to [employer] at [facility].  With AST/ALT trending up, she recommends to continue Acetadote for another 24 hrs and check AST/ALT, INR at the 24 hr mark to assess if Acetadote needs to be continued or not.  Plan: - Continue Acetadote at 15 mg/kg/hr for another 24 hrs - check AST/ALT and INR at 1330 on 9/7  Dorna Leitz, PharmD, BCPS 06/12/2018 2:13 PM

## 2018-06-13 LAB — HEPATITIS PANEL, ACUTE
HCV Ab: <0.1 s/co ratio (ref 0.0–0.9)
Hep A IgM: NEGATIVE
Hep B C IgM: NEGATIVE
Hepatitis B Surface Ag: NEGATIVE

## 2018-06-13 LAB — CBC
HCT: 42.9 % (ref 36.0–46.0)
Hemoglobin: 14.5 g/dL (ref 12.0–15.0)
MCH: 32.1 pg (ref 26.0–34.0)
MCHC: 33.8 g/dL (ref 30.0–36.0)
MCV: 94.9 fL (ref 78.0–100.0)
PLATELETS: 230 10*3/uL (ref 150–400)
RBC: 4.52 MIL/uL (ref 3.87–5.11)
RDW: 12.9 % (ref 11.5–15.5)
WBC: 3.7 10*3/uL — AB (ref 4.0–10.5)

## 2018-06-13 LAB — COMPREHENSIVE METABOLIC PANEL
ALBUMIN: 4.3 g/dL (ref 3.5–5.0)
ALT: 703 U/L — AB (ref 0–44)
AST: 318 U/L — AB (ref 15–41)
Alkaline Phosphatase: 48 U/L (ref 38–126)
Anion gap: 10 (ref 5–15)
BILIRUBIN TOTAL: 1 mg/dL (ref 0.3–1.2)
CO2: 25 mmol/L (ref 22–32)
CREATININE: 0.51 mg/dL (ref 0.44–1.00)
Calcium: 10.1 mg/dL (ref 8.9–10.3)
Chloride: 106 mmol/L (ref 98–111)
GFR calc Af Amer: >60 mL/min (ref >60)
GLUCOSE: 103 mg/dL — AB (ref 70–99)
Potassium: 3.3 mmol/L — ABNORMAL LOW (ref 3.5–5.1)
Sodium: 141 mmol/L (ref 135–145)
TOTAL PROTEIN: 7.5 g/dL (ref 6.5–8.1)

## 2018-06-13 LAB — URINE CULTURE

## 2018-06-13 LAB — ACETAMINOPHEN LEVEL

## 2018-06-13 LAB — PROTIME-INR
INR: 1.1
Prothrombin Time: 14.1 seconds (ref 11.4–15.2)

## 2018-06-13 MED ORDER — POTASSIUM CHLORIDE CRYS ER 20 MEQ PO TBCR
40.0000 meq | EXTENDED_RELEASE_TABLET | Freq: Once | ORAL | Status: AC
  Administered 2018-06-13: 40 meq via ORAL
  Filled 2018-06-13: qty 2

## 2018-06-13 NOTE — Progress Notes (Signed)
Pharmacy: Re- Acetadote  Patient's a 22 y.o F presented to the ED on 9/5 with tylenol overdose.  Acetadote drip started on admission.  - 9/5 at 1353:  tylenol level = 41; INR = 1.07;                         AST/ALT= 133/114; TBili= 0.7         at 1535: Acetadote drip started - 9/6 at 0553:  INR = up 1.37; AST/ALT= up 401/314;                        Tbili= up 0.9         at 1013:  INR= down 1.27         at 1330:  APAP <10; AST/ALT 1047/760  9/7 at 1335 INR 1.10 AST/ALT 318/703, Apap < 10 Spoke with Bryson Ha  Rec: she  has met criteria to stop acetadote.   Plan: dc acetadone after this bag infused.  No more labs needed per PC.  They have closed her case  Eudelia Bunch, Pharm.D [phone] 06/13/2018 2:53 PM

## 2018-06-13 NOTE — Progress Notes (Signed)
[facility] Progress Note  Chief Complaint: Elevated transaminases from Tylenol overdose  History:  She continues to feel well today, and appetite has returned.  She denies abdominal pain, nausea, vomiting, hematemesis, passage of black or bloody stool.  ROS: Cardiovascular:  no chest pain Respiratory: no dyspnea  Objective:  Med list reviewed  Vital signs in last 24 hrs: Vitals:   06/12/18 2123 06/13/18 0517  BP: 102/68 121/90  Pulse: 65 69  Resp: 15 16  Temp: 98.9 F (37.2 C) 98 F (36.7 C)  SpO2: 100% 100%    Physical Exam  She is well-appearing.  Her nurse Marchelle Folks was present for the entire encounter  HEENT: sclera anicteric, oral mucosa moist without lesions  Cardiac: RRR without murmurs, S1S2 heard, no peripheral edema  Pulm: clear to auscultation bilaterally, normal RR and effort noted  Abdomen: soft, no tenderness, with active bowel sounds. No guarding or palpable hepatosplenomegaly  Skin; warm and dry, no jaundice or rash.  Multiple tattoos  Neuro: Alert and oriented x3, pleasant and conversational, normal gross motor function and fluent speech, no asterixis  Recent Labs:  Recent Labs  Lab 06/11/18 1353 06/11/18 1852 06/12/18 0553  WBC 6.4  --  7.6  HGB 15.6* 14.7 13.6  HCT 45.0 43.2 40.5  PLT 362  --  251   Recent Labs  Lab 06/12/18 1317  NA 141  K 3.7  CL 108  CO2 23  BUN 5*  ALBUMIN 3.7  ALKPHOS 43  ALT 760*  AST 1,047*  GLUCOSE 97   Recent Labs  Lab 06/12/18 2156  INR 1.14   Hepatic function from today is pending Radiologic studies: None today  @ASSESSMENTPLANBEGIN @ Assessment:  Transaminitis from intentional Tylenol overdose with concomitant alcohol.  Fortunately, her INR only mildly elevated and has now normalized.  It would be typical for the liver chemistries to rise for another couple of days before peaking.  This should be followed daily until it peaks.  Reported hematemesis during the acute illness from nausea and  vomiting.  Hemoglobin is remained normal, no ongoing overt GI bleeding.  No upper endoscopy planned.  Short course of acid suppression as recommended yesterday.  Regular diet  Signing off, call as needed.  Total time 25 minutes, over half spent face-to-face with patient counseling and coordinating care.  Charlie Pitter III Office: [phone]

## 2018-06-13 NOTE — Plan of Care (Signed)
  Problem: Activity: Goal: Risk for activity intolerance will decrease Outcome: Progressing   Problem: Elimination: Goal: Will not experience complications related to bowel motility Outcome: Progressing Goal: Will not experience complications related to urinary retention Outcome: Progressing   Problem: Education: Goal: Knowledge of warning signs, risks, and behaviors that relate to suicide ideation and self-harm behaviors will improve Outcome: Progressing   Problem: Coping: Goal: Ability to disclose and discuss thoughts of suicide and self-harm will improve Outcome: Progressing

## 2018-06-13 NOTE — Progress Notes (Signed)
PROGRESS NOTE    MONTINE HIGHT  WUJ:[id number] DOB: [birth year] DOA: 06/11/2018 PCP: Patient, No Pcp Per    Brief Narrative:  22 year old female history of significant of depression, prior suicidal attempts in the past presenting to the ED after overdose on Tylenol with nausea vomiting and hematemesis.  Tylenol level noted to be elevated.  Transaminitis elevated.  Patient admitted for Tylenol overdose and placed on N-acetylcysteine.  Poison control contacted.  Worsening LFTs and as such GI consulted.   Assessment & Plan:   Principal Problem:   Tylenol overdose Active Problems:   Overdose   Suicide attempt (HCC)   Transaminitis   Hypokalemia   Depression   Dehydration   Hematemesis/vomiting blood   Alcohol use   MDD (major depressive disorder), recurrent severe, without psychosis (HCC)   Acetaminophen toxicity  #1 Tylenol overdose Patient had presented with Tylenol overdose with suicidal ideation.  Noted to have elevated transaminitis on admission.  EKG with short PR interval on admission as well as normal QTC.  LFTs trending up as of yesterday afternoon 06/12/2018..  INR slowly trending up and trending back down currently at 1.14 from 1.27 from 1.37 from 1.07 on admission.  Labs pending. Patient currently on N-acetylcysteine which we will continue for now.  Tylenol level on admission was elevated at 41 now less than 10.  Due to rising transaminitis and INR continue N-acetylcysteine and repeat LFTs, INR and Tylenol level pending for today, 06/13/2018 at 1:30 PM.  Patient seen in consultation by gastroenterology who feel that patient's INR fortunately has now normalized and typical for LFTs to rise for another couple of days before peaking and trending back down.  Follow daily liver chemistries.  Appreciate GI input and recommendations.  Patient has been assessed by psychiatry who are recommending inpatient psychiatric admission once medically stable.  2.  Suicidal ideation/suicide  attempt See problem #1.  Psych has assessed patient and recommended an inpatient psychiatric admission once patient is medically stable.   3.  Hypokalemia Likely secondary to GI losses.  Repleted.  Magnesium at 2.3.  Labs pending for today.  4.  Dehydration Saline lock IV fluids.  5.  Alcohol use Patient with no signs of withdrawal.  Continue the Ativan withdrawal protocol.  6.  Hematemesis Likely secondary to a Mallory-Weiss tear versus gastritis.  Hemoglobin at 13.6 from 15.6 on admission.  Decreasing hemoglobin likely dilutional in nature.  Patient with no further episodes of hematemesis.  Patient tolerating regular diet.  Continue PPI.  Appreciate GI input and recommendations.   7.  Transaminitis Likely secondary to problem #1.  Acute hepatitis panel pending.  Abdominal ultrasound has been discontinued per general surgery.  LFTs still trending up and hopefully should peak in the next 1 to 2 days per GI and to start trending down.  INR trended back down and has normalized.  Labs pending.  Continue Mucomyst.    DVT prophylaxis: SCDs Code Status: Full Family Communication: Updated patient.  No family present. Disposition Plan: To [facility] once medically stable.     Consultants:   Gastroenterology: Dr. Myrtie Neither III 06/12/2018  Psychiatry:.  Nanine Means, NP 06/12/2018  Procedures:   Chest x-ray 06/11/2018    Antimicrobials:   None   Subjective: Patient sitting up in bed.  Denies any nausea or emesis or hematemesis.  Denies any abdominal pain.  Tolerating current regular diet.   Objective: Vitals:   06/12/18 1236 06/12/18 1746 06/12/18 2123 06/13/18 0517  BP: 113/80 113/73 102/68 121/90  Pulse: Marland Kitchen)  58 64 65 69  Resp: 16 16 15 16   Temp: 98.4 F (36.9 C) 98.8 F (37.1 C) 98.9 F (37.2 C) 98 F (36.7 C)  TempSrc: Oral Oral Oral Oral  SpO2: 98% 100% 100% 100%  Weight:      Height:        Intake/Output Summary (Last 24 hours) at 06/13/2018 1040 Last data filed at 06/13/2018  0800 Gross per 24 hour  Intake 1031.86 ml  Output 3800 ml  Net -2768.14 ml   Filed Weights   06/11/18 1351  Weight: 56.7 kg    Examination:  General exam: NAD Respiratory system: CTAB.  No wheezes, no crackles, no rhonchi.  Respiratory effort normal. Cardiovascular system: Regular rate and rhythm no murmurs rubs or gallops.  No JVD.  No lower extremity edema.  Gastrointestinal system: Abdomen is soft, nontender, nondistended, positive bowel sounds.  No rebound.  No guarding.   Central nervous system: Alert and oriented. No focal neurological deficits. Extremities: Symmetric 5 x 5 power. Skin: No rashes, lesions or ulcers Psychiatry: Judgement and insight appear normal. Mood & affect appropriate.     Data Reviewed: I have personally reviewed following labs and imaging studies  CBC: Recent Labs  Lab 06/11/18 1353 06/11/18 1852 06/12/18 0553  WBC 6.4  --  7.6  HGB 15.6* 14.7 13.6  HCT 45.0 43.2 40.5  MCV 94.5  --  96.9  PLT 362  --  251   Basic Metabolic Panel: Recent Labs  Lab 06/11/18 1353 06/12/18 0553 06/12/18 1317  NA 142 139 141  K 3.0* 3.6 3.7  CL 102 107 108  CO2 25 23 23   GLUCOSE 135* 96 97  BUN 14 7 5*  CREATININE 0.79 0.64 0.61  CALCIUM 10.7* 8.9 9.1  MG 2.3  --   --    GFR: Estimated Creatinine Clearance: 98.7 mL/min (by C-G formula based on SCr of 0.61 mg/dL). Liver Function Tests: Recent Labs  Lab 06/11/18 1353 06/12/18 0553 06/12/18 1317  AST 133* 401* 1,047*  ALT 114* 314* 760*  ALKPHOS 58 39 43  BILITOT 0.7 0.9 0.8  PROT 9.5* 6.4* 6.5  ALBUMIN 5.6* 3.6 3.7   No results for input(s): LIPASE, AMYLASE in the last 168 hours. No results for input(s): AMMONIA in the last 168 hours. Coagulation Profile: Recent Labs  Lab 06/11/18 1353 06/12/18 0553 06/12/18 1013 06/12/18 2156  INR 1.07 1.37 1.27 1.14   Cardiac Enzymes: No results for input(s): CKTOTAL, CKMB, CKMBINDEX, TROPONINI in the last 168 hours. BNP (last 3 results) No  results for input(s): PROBNP in the last 8760 hours. HbA1C: No results for input(s): HGBA1C in the last 72 hours. CBG: Recent Labs  Lab 06/11/18 1402  GLUCAP 131*   Lipid Profile: No results for input(s): CHOL, HDL, LDLCALC, TRIG, CHOLHDL, LDLDIRECT in the last 72 hours. Thyroid Function Tests: No results for input(s): TSH, T4TOTAL, FREET4, T3FREE, THYROIDAB in the last 72 hours. Anemia Panel: No results for input(s): VITAMINB12, FOLATE, FERRITIN, TIBC, IRON, RETICCTPCT in the last 72 hours. Sepsis Labs: No results for input(s): PROCALCITON, LATICACIDVEN in the last 168 hours.  No results found for this or any previous visit (from the past 240 hour(s)).       Radiology Studies: Dg Chest 2 View  Result Date: 06/11/2018 CLINICAL DATA:  Per Pt: Pt reports she took half a bottle of 500mg  acetaminophen tablets. Pt reports it was a suicide attempt, vomiting today, pt denies any chest complaints. Smoker. EXAM: CHEST - 2  VIEW COMPARISON:  03/06/2016 FINDINGS: The heart size and mediastinal contours are within normal limits. Both lungs are clear. The visualized skeletal structures are unremarkable. IMPRESSION: No active cardiopulmonary disease. Electronically Signed   By: Norva Pavlov M.D.   On: 06/11/2018 18:11        Scheduled Meds: . folic acid  1 mg Oral Daily  . loratadine  10 mg Oral Daily  . multivitamin with minerals  1 tablet Oral Daily  . norgestrel-ethinyl estradiol  1 tablet Oral Daily  . pantoprazole  40 mg Oral Daily  . thiamine  100 mg Oral Daily   Or  . thiamine  100 mg Intravenous Daily   Continuous Infusions: . acetylcysteine 15 mg/kg/hr (06/12/18 1927)     LOS: 2 days    Time spent: 35 mins    Ramiro Harvest, MD [facility] Pager [phone]  If 7PM-7AM, please contact night-coverage [email or web address] Password [email or web address] 06/13/2018, 10:40 AM

## 2018-06-14 LAB — COMPREHENSIVE METABOLIC PANEL
ALBUMIN: 4.5 g/dL (ref 3.5–5.0)
ALT: 560 U/L — ABNORMAL HIGH (ref 0–44)
ANION GAP: 10 (ref 5–15)
AST: 141 U/L — ABNORMAL HIGH (ref 15–41)
Alkaline Phosphatase: 53 U/L (ref 38–126)
BUN: 7 mg/dL (ref 6–20)
CO2: 27 mmol/L (ref 22–32)
Calcium: 10.3 mg/dL (ref 8.9–10.3)
Chloride: 105 mmol/L (ref 98–111)
Creatinine, Ser: 0.6 mg/dL (ref 0.44–1.00)
GFR calc non Af Amer: >60 mL/min (ref >60)
Glucose, Bld: 91 mg/dL (ref 70–99)
Potassium: 3.8 mmol/L (ref 3.5–5.1)
SODIUM: 142 mmol/L (ref 135–145)
Total Bilirubin: 0.6 mg/dL (ref 0.3–1.2)
Total Protein: 8.3 g/dL — ABNORMAL HIGH (ref 6.5–8.1)

## 2018-06-14 LAB — CBC
HEMATOCRIT: 46.7 % — AB (ref 36.0–46.0)
HEMOGLOBIN: 15.9 g/dL — AB (ref 12.0–15.0)
MCH: 32.6 pg (ref 26.0–34.0)
MCHC: 34 g/dL (ref 30.0–36.0)
MCV: 95.9 fL (ref 78.0–100.0)
Platelets: 238 10*3/uL (ref 150–400)
RBC: 4.87 MIL/uL (ref 3.87–5.11)
RDW: 13 % (ref 11.5–15.5)
WBC: 3.3 10*3/uL — ABNORMAL LOW (ref 4.0–10.5)

## 2018-06-14 LAB — PROTIME-INR
INR: 0.98
Prothrombin Time: 12.8 seconds (ref 11.4–15.2)

## 2018-06-14 LAB — ACETAMINOPHEN LEVEL: Acetaminophen (Tylenol), Serum: <10 ug/mL — ABNORMAL LOW (ref 10–30)

## 2018-06-14 NOTE — Progress Notes (Signed)
Care resumed for this patient from previous RN. Agree with previous assessment. Will continue to monitor.  

## 2018-06-14 NOTE — Progress Notes (Signed)
PROGRESS NOTE    Tammy Lowe  MCN:[id number] DOB: 04/27/1996 DOA: 06/11/2018 PCP: Patient, No Pcp Per    Brief Narrative:  22 year old female history of significant of depression, prior suicidal attempts in the past presenting to the ED after overdose on Tylenol with nausea vomiting and hematemesis.  Tylenol level noted to be elevated.  Transaminitis elevated.  Patient admitted for Tylenol overdose and placed on N-acetylcysteine.  Poison control contacted.  Worsening LFTs and as such GI consulted.   Assessment & Plan:   Principal Problem:   Tylenol overdose Active Problems:   Overdose   Suicide attempt (HCC)   Transaminitis   Hypokalemia   Depression   Dehydration   Hematemesis/vomiting blood   Alcohol use   MDD (major depressive disorder), recurrent severe, without psychosis (HCC)   Acetaminophen toxicity  #1 Tylenol overdose Patient had presented with Tylenol overdose with suicidal ideation.  Noted to have elevated transaminitis on admission.  EKG with short PR interval on admission as well as normal QTC.  LFTs trending up as 06/12/2018.  LFTs are trending back down.  INR has trended down and has normalized. Patient was on N-acetylcysteine which was discontinued last night in collaboration with poison control.  Tylenol level on admission was elevated at 41 now less than 10. Patient seen in consultation by gastroenterology who feel that patient's INR fortunately has now normalized and typical for LFTs to rise for another couple of days before peaking and trending back down.  Left to seem to be trending down.  Appreciate GI input and recommendations.  Patient has been assessed by psychiatry who are recommending inpatient psychiatric admission once medically stable.  2.  Suicidal ideation/suicide attempt See problem #1.  Psych has assessed patient and recommended an inpatient psychiatric admission once patient is medically stable.   3.  Hypokalemia Likely secondary to GI losses.   Repleted.  Magnesium at 2.3.    4.  Dehydration Patient currently euvolemic.  IV fluids have been saline locked.    5.  Alcohol use Patient with no signs of withdrawal.  Continue the Ativan withdrawal protocol.  6.  Hematemesis Likely secondary to a Mallory-Weiss tear versus gastritis.  Hemoglobin at 15.9 from 14.5 from 13.6 from 15.6 on admission.  Decreasing hemoglobin likely dilutional in nature.  Patient with no further episodes of hematemesis.  Patient tolerating regular diet.  Continue PPI.  Appreciate GI input and recommendations.   7.  Transaminitis Likely secondary to problem #1.  Acute hepatitis panel negative.  HIV nonreactive.  Abdominal ultrasound has been discontinued per general surgery.  LFTs starting to trend back down.  INR has trended back down.  Mucomyst was discontinued last night.  Follow.     DVT prophylaxis: SCDs Code Status: Full Family Communication: Updated patient.  No family present. Disposition Plan: To [facility] once medically stable hopefully tomorrow.     Consultants:   Gastroenterology: Dr. Myrtie Neither III 06/12/2018  Psychiatry:.  Nanine Means, NP 06/12/2018  Procedures:   Chest x-ray 06/11/2018    Antimicrobials:   None   Subjective: Patient sitting up in bed.  Denies chest pain or shortness of breath.  No nausea or emesis.  No further hematemesis.  No abdominal pain.  Feeling much better.  Tolerating current diet.    Objective: Vitals:   06/13/18 1238 06/13/18 2005 06/14/18 0519 06/14/18 1140  BP: 115/80 114/78 113/87 115/74  Pulse: (!) 59 75 62 74  Resp:  18 20 16   Temp: 98 F (36.7 C) 98.8  F (37.1 C) 97.9 F (36.6 C) 98.2 F (36.8 C)  TempSrc: Oral Oral Oral Oral  SpO2: 100% 100% 100% 100%  Weight:      Height:        Intake/Output Summary (Last 24 hours) at 06/14/2018 1218 Last data filed at 06/14/2018 1100 Gross per 24 hour  Intake 1086.61 ml  Output 1800 ml  Net -713.39 ml   Filed Weights   06/11/18 1351  Weight: 56.7 kg     Examination:  General exam: NAD Respiratory system: Lungs clear to auscultation bilaterally.  No wheezes, no crackles, no rhonchi.  Normal respiratory effort.   Cardiovascular system: RRR no murmurs rubs or gallops.  No JVD.  No lower extremity edema.   Gastrointestinal system: Abdomen is nontender, nondistended, soft, positive bowel sounds.  No rebound.  No guarding.     Central nervous system: Alert and oriented. No focal neurological deficits. Extremities: Symmetric 5 x 5 power. Skin: No rashes, lesions or ulcers Psychiatry: Judgement and insight appear normal. Mood & affect appropriate.     Data Reviewed: I have personally reviewed following labs and imaging studies  CBC: Recent Labs  Lab 06/11/18 1353 06/11/18 1852 06/12/18 0553 06/13/18 1336 06/14/18 0516  WBC 6.4  --  7.6 3.7* 3.3*  HGB 15.6* 14.7 13.6 14.5 15.9*  HCT 45.0 43.2 40.5 42.9 46.7*  MCV 94.5  --  96.9 94.9 95.9  PLT 362  --  251 230 238   Basic Metabolic Panel: Recent Labs  Lab 06/11/18 1353 06/12/18 0553 06/12/18 1317 06/13/18 1336 06/14/18 0516  NA 142 139 141 141 142  K 3.0* 3.6 3.7 3.3* 3.8  CL 102 107 108 106 105  CO2 25 23 23 25 27   GLUCOSE 135* 96 97 103* 91  BUN 14 7 5* <5* 7  CREATININE 0.79 0.64 0.61 0.51 0.60  CALCIUM 10.7* 8.9 9.1 10.1 10.3  MG 2.3  --   --   --   --    GFR: Estimated Creatinine Clearance: 98.7 mL/min (by C-G formula based on SCr of 0.6 mg/dL). Liver Function Tests: Recent Labs  Lab 06/11/18 1353 06/12/18 0553 06/12/18 1317 06/13/18 1336 06/14/18 0516  AST 133* 401* 1,047* 318* 141*  ALT 114* 314* 760* 703* 560*  ALKPHOS 58 39 43 48 53  BILITOT 0.7 0.9 0.8 1.0 0.6  PROT 9.5* 6.4* 6.5 7.5 8.3*  ALBUMIN 5.6* 3.6 3.7 4.3 4.5   No results for input(s): LIPASE, AMYLASE in the last 168 hours. No results for input(s): AMMONIA in the last 168 hours. Coagulation Profile: Recent Labs  Lab 06/12/18 0553 06/12/18 1013 06/12/18 2156 06/13/18 1336  06/14/18 0516  INR 1.37 1.27 1.14 1.10 0.98   Cardiac Enzymes: No results for input(s): CKTOTAL, CKMB, CKMBINDEX, TROPONINI in the last 168 hours. BNP (last 3 results) No results for input(s): PROBNP in the last 8760 hours. HbA1C: No results for input(s): HGBA1C in the last 72 hours. CBG: Recent Labs  Lab 06/11/18 1402  GLUCAP 131*   Lipid Profile: No results for input(s): CHOL, HDL, LDLCALC, TRIG, CHOLHDL, LDLDIRECT in the last 72 hours. Thyroid Function Tests: No results for input(s): TSH, T4TOTAL, FREET4, T3FREE, THYROIDAB in the last 72 hours. Anemia Panel: No results for input(s): VITAMINB12, FOLATE, FERRITIN, TIBC, IRON, RETICCTPCT in the last 72 hours. Sepsis Labs: No results for input(s): PROCALCITON, LATICACIDVEN in the last 168 hours.  Recent Results (from the past 240 hour(s))  Culture, Urine     Status: Abnormal  Collection Time: 06/11/18  7:24 PM  Result Value Ref Range Status   Specimen Description   Final    URINE, CLEAN CATCH Performed at [facility], [address] [place], [place] [address]    Special Requests   Final    NONE Performed at [facility], [address], [place], [place] [address]    Culture MULTIPLE SPECIES PRESENT, SUGGEST RECOLLECTION (A)  Final   Report Status 06/13/2018 FINAL  Final         Radiology Studies: No results found.      Scheduled Meds: . folic acid  1 mg Oral Daily  . loratadine  10 mg Oral Daily  . multivitamin with minerals  1 tablet Oral Daily  . norgestrel-ethinyl estradiol  1 tablet Oral Daily  . pantoprazole  40 mg Oral Daily  . thiamine  100 mg Oral Daily   Or  . thiamine  100 mg Intravenous Daily   Continuous Infusions:    LOS: 3 days    Time spent: 35 mins    Ramiro Harvest, MD [facility] Pager [phone]  If 7PM-7AM, please contact night-coverage [email or web address] Password [facility] 06/14/2018, 12:18 PM

## 2018-06-15 ENCOUNTER — Encounter (HOSPITAL_COMMUNITY): Payer: Self-pay | Admitting: *Deleted

## 2018-06-15 ENCOUNTER — Other Ambulatory Visit: Payer: Self-pay

## 2018-06-15 ENCOUNTER — Inpatient Hospital Stay (HOSPITAL_COMMUNITY)
Admission: AD | Admit: 2018-06-15 | Discharge: 2018-06-18 | DRG: 885 | Disposition: A | Discharge status: Home / Self Care | Payer: Federal, State, Local not specified - Other | Source: Intra-hospital | Attending: Psychiatry | Admitting: Psychiatry

## 2018-06-15 DIAGNOSIS — F1010 Alcohol abuse, uncomplicated: Secondary | ICD-10-CM | POA: Diagnosis present

## 2018-06-15 DIAGNOSIS — Z915 Personal history of self-harm: Secondary | ICD-10-CM

## 2018-06-15 DIAGNOSIS — E860 Dehydration: Secondary | ICD-10-CM | POA: Diagnosis not present

## 2018-06-15 DIAGNOSIS — F410 Panic disorder [episodic paroxysmal anxiety] without agoraphobia: Secondary | ICD-10-CM | POA: Diagnosis present

## 2018-06-15 DIAGNOSIS — G4700 Insomnia, unspecified: Secondary | ICD-10-CM | POA: Diagnosis present

## 2018-06-15 DIAGNOSIS — F17210 Nicotine dependence, cigarettes, uncomplicated: Secondary | ICD-10-CM | POA: Diagnosis present

## 2018-06-15 DIAGNOSIS — F4310 Post-traumatic stress disorder, unspecified: Secondary | ICD-10-CM | POA: Diagnosis present

## 2018-06-15 DIAGNOSIS — F332 Major depressive disorder, recurrent severe without psychotic features: Principal | ICD-10-CM | POA: Diagnosis present

## 2018-06-15 DIAGNOSIS — F1290 Cannabis use, unspecified, uncomplicated: Secondary | ICD-10-CM | POA: Diagnosis present

## 2018-06-15 DIAGNOSIS — Z79899 Other long term (current) drug therapy: Secondary | ICD-10-CM | POA: Diagnosis not present

## 2018-06-15 DIAGNOSIS — Z818 Family history of other mental and behavioral disorders: Secondary | ICD-10-CM

## 2018-06-15 DIAGNOSIS — Z9889 Other specified postprocedural states: Secondary | ICD-10-CM | POA: Diagnosis not present

## 2018-06-15 DIAGNOSIS — F633 Trichotillomania: Secondary | ICD-10-CM | POA: Diagnosis present

## 2018-06-15 DIAGNOSIS — T391X2S Poisoning by 4-Aminophenol derivatives, intentional self-harm, sequela: Secondary | ICD-10-CM

## 2018-06-15 DIAGNOSIS — Z789 Other specified health status: Secondary | ICD-10-CM | POA: Diagnosis not present

## 2018-06-15 DIAGNOSIS — F329 Major depressive disorder, single episode, unspecified: Secondary | ICD-10-CM | POA: Diagnosis not present

## 2018-06-15 LAB — PROTIME-INR
INR: 0.91
Prothrombin Time: 12.2 seconds (ref 11.4–15.2)

## 2018-06-15 LAB — COMPREHENSIVE METABOLIC PANEL
ALBUMIN: 4.1 g/dL (ref 3.5–5.0)
ALT: 310 U/L — ABNORMAL HIGH (ref 0–44)
AST: 53 U/L — AB (ref 15–41)
Alkaline Phosphatase: 52 U/L (ref 38–126)
Anion gap: 7 (ref 5–15)
BILIRUBIN TOTAL: 0.5 mg/dL (ref 0.3–1.2)
BUN: 9 mg/dL (ref 6–20)
CHLORIDE: 106 mmol/L (ref 98–111)
CO2: 26 mmol/L (ref 22–32)
Calcium: 9.8 mg/dL (ref 8.9–10.3)
Creatinine, Ser: 0.68 mg/dL (ref 0.44–1.00)
GFR calc Af Amer: >60 mL/min (ref >60)
GFR calc non Af Amer: >60 mL/min (ref >60)
GLUCOSE: 96 mg/dL (ref 70–99)
POTASSIUM: 3.6 mmol/L (ref 3.5–5.1)
Sodium: 139 mmol/L (ref 135–145)
TOTAL PROTEIN: 7.1 g/dL (ref 6.5–8.1)

## 2018-06-15 MED ORDER — PANTOPRAZOLE SODIUM 40 MG PO TBEC
40.0000 mg | DELAYED_RELEASE_TABLET | Freq: Every day | ORAL | Status: DC
  Administered 2018-06-16 – 2018-06-18 (×3): 40 mg via ORAL
  Filled 2018-06-15 (×5): qty 1

## 2018-06-15 MED ORDER — POTASSIUM CHLORIDE CRYS ER 20 MEQ PO TBCR
40.0000 meq | EXTENDED_RELEASE_TABLET | Freq: Once | ORAL | Status: AC
  Administered 2018-06-15: 40 meq via ORAL
  Filled 2018-06-15: qty 2

## 2018-06-15 MED ORDER — PANTOPRAZOLE SODIUM 40 MG PO TBEC: 40.0000 mg | DELAYED_RELEASE_TABLET | Freq: Every day | ORAL | 0 refills | Status: DC

## 2018-06-15 MED ORDER — ADULT MULTIVITAMIN W/MINERALS CH
1.0000 | ORAL_TABLET | Freq: Every day | ORAL | Status: DC
  Administered 2018-06-16 – 2018-06-18 (×3): 1 via ORAL
  Filled 2018-06-15 (×5): qty 1

## 2018-06-15 MED ORDER — TRAMADOL HCL 50 MG PO TABS: 50.0000 mg | ORAL_TABLET | Freq: Four times a day (QID) | ORAL | Status: DC | PRN

## 2018-06-15 MED ORDER — NORGESTREL-ETHINYL ESTRADIOL 0.3-30 MG-MCG PO TABS: 1.0000 | ORAL_TABLET | Freq: Every day | ORAL | Status: DC

## 2018-06-15 MED ORDER — ADULT MULTIVITAMIN W/MINERALS CH: 1.0000 | ORAL_TABLET | Freq: Every day | ORAL | Status: DC

## 2018-06-15 MED ORDER — ALUM & MAG HYDROXIDE-SIMETH 200-200-20 MG/5ML PO SUSP: 30.0000 mL | ORAL | Status: DC | PRN

## 2018-06-15 MED ORDER — LORATADINE 10 MG PO TABS
10.0000 mg | ORAL_TABLET | Freq: Every day | ORAL | Status: DC
  Administered 2018-06-16 – 2018-06-18 (×3): 10 mg via ORAL
  Filled 2018-06-15: qty 1
  Filled 2018-06-15: qty 7
  Filled 2018-06-15: qty 1
  Filled 2018-06-15: qty 7
  Filled 2018-06-15 (×2): qty 1

## 2018-06-15 MED ORDER — FOLIC ACID 1 MG PO TABS: 1.0000 mg | ORAL_TABLET | Freq: Every day | ORAL | 0 refills | Status: DC

## 2018-06-15 MED ORDER — TRAZODONE HCL 50 MG PO TABS
50.0000 mg | ORAL_TABLET | Freq: Every evening | ORAL | Status: DC | PRN
  Administered 2018-06-16 – 2018-06-17 (×2): 50 mg via ORAL
  Filled 2018-06-15: qty 7
  Filled 2018-06-15 (×2): qty 1

## 2018-06-15 MED ORDER — THIAMINE HCL 100 MG PO TABS: 100.0000 mg | ORAL_TABLET | Freq: Every day | ORAL | Status: DC

## 2018-06-15 MED ORDER — FOLIC ACID 1 MG PO TABS
1.0000 mg | ORAL_TABLET | Freq: Every day | ORAL | Status: DC
  Administered 2018-06-16 – 2018-06-18 (×3): 1 mg via ORAL
  Filled 2018-06-15 (×5): qty 1

## 2018-06-15 MED ORDER — SENNOSIDES-DOCUSATE SODIUM 8.6-50 MG PO TABS: 1.0000 | ORAL_TABLET | Freq: Every evening | ORAL | Status: DC | PRN

## 2018-06-15 NOTE — Clinical Social Work Note (Signed)
Clinical Social Work Assessment  Patient Details  Name: Tammy Lowe MRN: [id number] Date of Birth: [birth year]  Date of referral:  06/15/18               Reason for consult:  Facility Placement                Permission sought to share information with:    Permission granted to share information::  No  Name::        Agency::     Relationship::     Contact Information:     Housing/Transportation Living arrangements for the past 2 months:  Apartment Source of Information:  Patient Patient Interpreter Needed:  None Criminal Activity/Legal Involvement Pertinent to Current Situation/Hospitalization:  No - Comment as needed Significant Relationships:  Other(Comment)(Roommate) Lives with:  Roommate Do you feel safe going back to the place where you live?  Yes Need for family participation in patient care:  Yes (Comment)  Care giving concerns:  No care giving cocnerns at the time of assessment.    Social Worker assessment / plan:  LCSW consulted for inpatient psych placement.   Patient admitted for intentional OD. Patient has a a prior history of SI.   Patient reports that she is not seen in the community by a therapist or psychiatrist and is not currently on any psych meds. Patient reports that she is working PT. Patient states she lives with her best friend and roommate who she considers a support to her.   Patient is initially reluctant to inpatient psych stating she has to get back to work.  PLAN: Inpatient psych at dc. Patient is voluntary at the time of assessment.   Employment status:  Public affairs consultant information:  Self Pay (Medicaid Pending) PT Recommendations:  Not assessed at this time Information / Referral to community resources:     Patient/Family's Response to care:  Patient is thankful for LCSW visit and explanation of inpatient psych process.   Patient/Family's Understanding of and Emotional Response to Diagnosis, Current Treatment, and Prognosis:  It is  difficult to assess patient's understanding of diagnosis and treatment plan. Patient appears preoccupied with getting back to work. Patient is agreeable to inpatient psych at the time of assessment.   Emotional Assessment Appearance:  Appears stated age Attitude/Demeanor/Rapport:    Affect (typically observed):  Calm, Pleasant Orientation:  Oriented to Self, Oriented to Place, Oriented to  Time, Oriented to Situation Alcohol / Substance use:  Illicit Drugs Psych involvement (Current and /or in the community):  No (Comment)  Discharge Needs  Concerns to be addressed:  Mental Health Concerns, Lack of Support Readmission within the last 30 days:  No Current discharge risk:  None Barriers to Discharge:  No Barriers Identified   Coralyn Helling, LCSW 06/15/2018, 2:34 PM

## 2018-06-15 NOTE — Progress Notes (Signed)
Tammy Lowe is a 22 year old female pt admitted on voluntary basis from medical floor. On admission she does endorse that she took overdose in suicide attempt. She denies SI currently and is able to contract for safety on the unit. She reports that was not the first time she has attempted suicide. She reports that she has battled depression her entire life. She reports she was on medications but reports that she has not been on any in over a year. She does endorse marijuana usage and reports that she drinks while she is on the job (she is a Horticulturist, commercial) and denies any other substance abuse issues. She reports that she has a roommate that she stays with and reports that she will return there after discharge. She was escorted to the unit, oriented to the milieu and safety maintained.

## 2018-06-15 NOTE — Progress Notes (Signed)
Patient has bed at [facility]. Room 404, Bed 2  Patient can transport at 8pm by [facility].Transportation arranged at 3:33 pm. [phone]  Accepting: [place]  RN report #: [phone]

## 2018-06-15 NOTE — Tx Team (Signed)
Initial Treatment Plan 06/15/2018 9:02 PM KARENANN HEINKE ZOX:[id number]    PATIENT STRESSORS: Medication change or noncompliance Substance abuse   PATIENT STRENGTHS: Ability for insight Average or above average intelligence Capable of independent living General fund of knowledge   PATIENT IDENTIFIED PROBLEMS: Depression Anxiety Suicidal thoughts "I think I would like to get back on some medications"                     DISCHARGE CRITERIA:  Ability to meet basic life and health needs Improved stabilization in mood, thinking, and/or behavior Reduction of life-threatening or endangering symptoms to within safe limits Verbal commitment to aftercare and medication compliance  PRELIMINARY DISCHARGE PLAN: Attend aftercare/continuing care group Return to previous living arrangement  PATIENT/FAMILY INVOLVEMENT: This treatment plan has been presented to and reviewed with the patient, ANUSKA ELSER, and/or family member, .  The patient and family have been given the opportunity to ask questions and make suggestions.  Brad Lieurance, [place], [place] 06/15/2018, 9:02 PM

## 2018-06-15 NOTE — Progress Notes (Signed)
Gave report to [place], Charity fundraiser at [facility]. [facility] here to take patient to [facility].

## 2018-06-15 NOTE — Care Management Note (Signed)
Case Management Note  Patient Details  Name: Tammy Lowe MRN: [id number] Date of Birth: 1996/04/01  Subjective/Objective: Psych recc Inpt Psych-Psych CSW already following.                   Action/Plan:d/c IP psych.   Expected Discharge Date:  (unknown)               [facility]  [facility]  In-House Referral:  Clinical Social Work  Discharge planning Services     Post Acute Care Choice:    Choice offered to:     DME Arranged:    DME Agency:     HH Arranged:    HH Agency:     Status of Service:  Completed, signed off  If discussed at [employer], dates discussed:    Additional Comments:  Lanier Clam, RN 06/15/2018, 12:23 PM

## 2018-06-15 NOTE — Progress Notes (Addendum)
Per RN patient is medically stable to dc to inpatient psych.   LCSW referred patient to [facility]. Patient under review.   [name] [place] CSW [phone]

## 2018-06-15 NOTE — Discharge Summary (Signed)
Physician Discharge Summary  Tammy Lowe:[id number] DOB: November 14, 1995 DOA: 06/11/2018  PCP: Patient, No Pcp Per  Admit date: 06/11/2018 Discharge date: 06/15/2018  Time spent: 55 minutes  Recommendations for Outpatient Follow-up:  1. Discharge to inpatient psychiatry at [facility] when bed available. 2. Follow-up with PCP post discharge from inpatient psychiatry.   Discharge Diagnoses:  Principal Problem:   Tylenol overdose Active Problems:   Overdose   Suicide attempt (HCC)   Transaminitis   Hypokalemia   Depression   Dehydration   Hematemesis/vomiting blood   Alcohol use   MDD (major depressive disorder), recurrent severe, without psychosis (HCC)   Acetaminophen toxicity   Discharge Condition: Stable and improved  Diet recommendation: Regular  Filed Weights   06/11/18 1351  Weight: 56.7 kg    History of present illness:  Tammy Lowe is a 22 y.o. female with medical history significant of depression, prior suicidal attempts in the past 1 when she was 22 years old which she stated she took some aspirin and 1 on New Year's Eve 2018 where she stated she took a bunch of pills she is not sure of, who presents to the ED with overdosing on Tylenol.  Patient stated the night prior to admission she took half a bottle of 500 mg Tylenol tablets and a suicidal attempt in addition to 6 small airplane bottles of fireball.  Patient stated she woke up in the middle of the night with several bouts of nausea and greater than 10 episodes of hematemesis which she describes as a dark color.  Patient did endorse abdominal pain with hematemesis.  Patient denied any fever, no chills, no chest pain, no shortness of breath, no ongoing abdominal pain, no dysuria, no melena, no hematochezia, no syncope, no visual changes, no asymmetric weakness or numbness, no slurred speech, no focal neurological deficits.  Patient does endorse decreased oral intake over the past several days as she states she forgot  to eat.  ED Course: Patient seen in the ED, comprehensive metabolic profile with a potassium of 3.0, albumin of 5.6, AST of 133, ALT of 114, protein of 9.5 otherwise was within normal limits.  CBC had a hemoglobin of 15.6 otherwise was within normal limits.  INR was 1.07.  PTT was 30, PT was 13.9.  Acetaminophen level was 41.  Salicylate level was less than 7.  Fecal occult blood was negative.  UDS was positive for THC.  Urine pregnancy was negative.  EKG with short PR interval with T wave inversions in V3 to V6.  No prior EKG.  Patient started on Mucomyst/N-acetylcysteine in the ED after discussions with poison control.   Hospital Course:  1 Tylenol overdose Patient had presented with Tylenol overdose with suicidal ideation.  Noted to have elevated transaminitis on admission.  EKG with short PR interval on admission as well as normal QTC.  LFTs initially trended up peaked and subsequently started to trend back down.  INR also initially trended up and trended down and subsequently normalized.  Patient on admission was placed on N- acetylcysteine in collaboration with poison control who followed patient's progress during the hospitalization.  Tylenol level on admission was elevated at 41 now less than 10. Patient seen in consultation by gastroenterology who feel that patient's INR fortunately has now normalized and typical for LFTs to rise before peaking and trending back down.    Transaminitis trended down.  INR normalized.  Patient improved clinically diet advance to regular diet which he tolerated.  Patient seen  by GI who assessed patient and followed patient.  Patient has been assessed by psychiatry who recommended inpatient psychiatric admission once medically stable.  Patient currently medically stable and will be discharged to inpatient psychiatry at [facility] when bed available.  2.  Suicidal ideation/suicide attempt See problem #1.  Psych has assessed patient and recommended an inpatient psychiatric  admission once patient is medically stable.  Patient currently medically stable will be transferred to inpatient psychiatry once bed is available.  3.  Hypokalemia Likely secondary to GI losses.    Repleted.  Magnesium was also repleted.  Outpatient follow-up.    4.  Dehydration Patient noted to be dehydrated on admission.  Patient hydrated with IV fluids and was euvolemic by day of discharge.    5.  Alcohol use Patient with no signs of withdrawal throughout the hospitalization.  Patient was placed on Ativan withdrawal protocol, folic acid, multivitamin and thiamine.  Alcohol cessation was stressed to patient.  Outpatient follow-up.  6.  Hematemesis Likely secondary to a Mallory-Weiss tear versus gastritis.  Hemoglobin at 15.9 from 14.5 from 13.6 from 15.6 on admission.    Patient had a slight fluctuation in hemoglobin secondary to dilutional.  Patient had no further episodes of hematemesis during the hospitalization.  Patient's diet was advanced and patient tolerated a regular diet.  Patient maintained on the PPI which she will be discharged home on.  Patient was seen by GI and no further work-up required.    7.  Transaminitis Likely secondary to problem #1.  Acute hepatitis panel negative.  HIV nonreactive.  Abdominal ultrasound has been discontinued per general surgery.  LFTs trended down with treatment with Mucomyst.  INR trended down and normalized.  Mucomyst subsequently discontinued.  Outpatient follow-up.    Procedures:  Chest x-ray 06/11/2018    Consultations:  Gastroenterology: Dr. Myrtie Neither III 06/12/2018  Psychiatry:.  Nanine Means, NP 06/12/2018  Discharge Exam: Vitals:   06/15/18 0627 06/15/18 1358  BP: 123/83 126/84  Pulse: 71 78  Resp: 16 16  Temp: 98.2 F (36.8 C) 98 F (36.7 C)  SpO2: 100% 100%    General: NAD Cardiovascular: RRR Respiratory: CTAB  Discharge Instructions   Discharge Instructions    Diet general   Complete by:  As directed    Increase  activity slowly   Complete by:  As directed      Allergies as of 06/15/2018   No Known Allergies     Medication List    STOP taking these medications   amoxicillin 500 MG capsule Commonly known as:  AMOXIL   ibuprofen 800 MG tablet Commonly known as:  ADVIL,MOTRIN   naproxen 500 MG tablet Commonly known as:  NAPROSYN   ondansetron 8 MG disintegrating tablet Commonly known as:  ZOFRAN-ODT   orphenadrine 100 MG tablet Commonly known as:  NORFLEX     TAKE these medications   folic acid 1 MG tablet Commonly known as:  FOLVITE Take 1 tablet (1 mg total) by mouth daily. Start taking on:  06/16/2018   loratadine 10 MG tablet Commonly known as:  CLARITIN Take 10 mg by mouth daily.   multivitamin with minerals Tabs tablet Take 1 tablet by mouth daily. Start taking on:  06/16/2018   norgestrel-ethinyl estradiol 0.3-30 MG-MCG tablet Commonly known as:  LO/OVRAL,CRYSELLE Take 1 tablet by mouth daily.   pantoprazole 40 MG tablet Commonly known as:  PROTONIX Take 1 tablet (40 mg total) by mouth daily. Start taking on:  06/16/2018   thiamine 100  MG tablet Take 1 tablet (100 mg total) by mouth daily. Start taking on:  06/16/2018      No Known Allergies Follow-up Information    pcp Follow up.            The results of significant diagnostics from this hospitalization (including imaging, microbiology, ancillary and laboratory) are listed below for reference.    Significant Diagnostic Studies: Dg Chest 2 View  Result Date: 06/11/2018 CLINICAL DATA:  Per Pt: Pt reports she took half a bottle of 500mg  acetaminophen tablets. Pt reports it was a suicide attempt, vomiting today, pt denies any chest complaints. Smoker. EXAM: CHEST - 2 VIEW COMPARISON:  03/06/2016 FINDINGS: The heart size and mediastinal contours are within normal limits. Both lungs are clear. The visualized skeletal structures are unremarkable. IMPRESSION: No active cardiopulmonary disease. Electronically Signed    By: Norva Pavlov M.D.   On: 06/11/2018 18:11    Microbiology: Recent Results (from the past 240 hour(s))  Culture, Urine     Status: Abnormal   Collection Time: 06/11/18  7:24 PM  Result Value Ref Range Status   Specimen Description   Final    URINE, CLEAN CATCH Performed at [facility], [address] [place], [place] [address]    Special Requests   Final    NONE Performed at [facility], [address] [place], [place] [address]    Culture MULTIPLE SPECIES PRESENT, SUGGEST RECOLLECTION (A)  Final   Report Status 06/13/2018 FINAL  Final     Labs: Basic Metabolic Panel: Recent Labs  Lab 06/11/18 1353 06/12/18 0553 06/12/18 1317 06/13/18 1336 06/14/18 0516 06/15/18 0507  NA 142 139 141 141 142 139  K 3.0* 3.6 3.7 3.3* 3.8 3.6  CL 102 107 108 106 105 106  CO2 25 23 23 25 27 26   GLUCOSE 135* 96 97 103* 91 96  BUN 14 7 5* <5* 7 9  CREATININE 0.79 0.64 0.61 0.51 0.60 0.68  CALCIUM 10.7* 8.9 9.1 10.1 10.3 9.8  MG 2.3  --   --   --   --   --    Liver Function Tests: Recent Labs  Lab 06/12/18 0553 06/12/18 1317 06/13/18 1336 06/14/18 0516 06/15/18 0507  AST 401* 1,047* 318* 141* 53*  ALT 314* 760* 703* 560* 310*  ALKPHOS 39 43 48 53 52  BILITOT 0.9 0.8 1.0 0.6 0.5  PROT 6.4* 6.5 7.5 8.3* 7.1  ALBUMIN 3.6 3.7 4.3 4.5 4.1   No results for input(s): LIPASE, AMYLASE in the last 168 hours. No results for input(s): AMMONIA in the last 168 hours. CBC: Recent Labs  Lab 06/11/18 1353 06/11/18 1852 06/12/18 0553 06/13/18 1336 06/14/18 0516  WBC 6.4  --  7.6 3.7* 3.3*  HGB 15.6* 14.7 13.6 14.5 15.9*  HCT 45.0 43.2 40.5 42.9 46.7*  MCV 94.5  --  96.9 94.9 95.9  PLT 362  --  251 230 238   Cardiac Enzymes: No results for input(s): CKTOTAL, CKMB, CKMBINDEX, TROPONINI in the last 168 hours. BNP: BNP (last 3 results) No results for input(s): BNP in the last 8760 hours.  ProBNP (last 3 results) No results for  input(s): PROBNP in the last 8760 hours.  CBG: Recent Labs  Lab 06/11/18 1402  GLUCAP 131*       Signed:  Ramiro Harvest MD.  [facility] 06/15/2018, 3:30 PM

## 2018-06-16 DIAGNOSIS — F332 Major depressive disorder, recurrent severe without psychotic features: Principal | ICD-10-CM

## 2018-06-16 LAB — HEPATIC FUNCTION PANEL
ALT: 201 U/L — AB (ref 0–44)
AST: 33 U/L (ref 15–41)
Albumin: 4.7 g/dL (ref 3.5–5.0)
Alkaline Phosphatase: 57 U/L (ref 38–126)
BILIRUBIN DIRECT: 0.1 mg/dL (ref 0.0–0.2)
Indirect Bilirubin: 0.7 mg/dL (ref 0.3–0.9)
Total Bilirubin: 0.8 mg/dL (ref 0.3–1.2)
Total Protein: 8 g/dL (ref 6.5–8.1)

## 2018-06-16 MED ORDER — FLUOXETINE HCL 10 MG PO CAPS
10.0000 mg | ORAL_CAPSULE | Freq: Every day | ORAL | Status: DC
  Administered 2018-06-16 – 2018-06-17 (×2): 10 mg via ORAL
  Filled 2018-06-16 (×3): qty 1

## 2018-06-16 NOTE — BHH Group Notes (Signed)
LCSW Group Therapy Note 06/16/2018 11:22 AM  Type of Therapy and Topic: Group Therapy: Overcoming Obstacles  Participation Level: Active  Description of Group:  In this group patients will be encouraged to explore what they see as obstacles to their own wellness and recovery. They will be guided to discuss their thoughts, feelings, and behaviors related to these obstacles. The group will process together ways to cope with barriers, with attention given to specific choices patients can make. Each patient will be challenged to identify changes they are motivated to make in order to overcome their obstacles. This group will be process-oriented, with patients participating in exploration of their own experiences as well as giving and receiving support and challenge from other group members.  Therapeutic Goals: 1. Patient will identify personal and current obstacles as they relate to admission. 2. Patient will identify barriers that currently interfere with their wellness or overcoming obstacles.  3. Patient will identify feelings, thought process and behaviors related to these barriers. 4. Patient will identify two changes they are willing to make to overcome these obstacles:   Summary of Patient Progress  "Tammy Lowe" was engaged and participated throughout the group session. She shared that her main obstacle currently is "lack of motivation". Patrice reports that negative thinking and poor decisions were her current barriers.    Therapeutic Modalities:  Cognitive Behavioral Therapy Solution Focused Therapy Motivational Interviewing Relapse Prevention Therapy   Alcario Drought Clinical Social Worker

## 2018-06-16 NOTE — BHH Counselor (Signed)
Adult Comprehensive Assessment  Patient ID: Tammy Lowe, female   DOB: [birth year], 22 y.o.   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   Employment / Job issues: "I hate my job"  but I'm stuck with needing the money. Social relationships: Pt reports she is around people but "doesn't talk to anyone."  Keeps everything inside.   Bereavement / Loss: father died when pt was 41  Living/Environment/Situation:  Living Arrangements: Non-relatives/Friends Living conditions (as described by patient or guardian): goes well, safe. Who else lives in the home?: roomate How long has patient lived in current situation?: 3 months What is atmosphere in current home: Comfortable, Supportive  Family History:  Marital status: Single Are you sexually active?: Yes What is your sexual orientation?: heterosexual Has your sexual activity been affected by drugs, alcohol, medication, or emotional stress?: no Does patient have children?: No  Childhood History:  By whom was/is the patient raised?: Both parents Additional childhood history information: Parents remained married until dad died Mar 10, 2009.  mostly good childhood,  father died when pt was 18. Description of patient's relationship with caregiver when they were a child: WUJ:WJXB  dad: amazing, "he was my best friend" Patient's description of current relationship with people who raised him/her: mom: fine, but we don't talk that often.  dad: deceased How were you disciplined when you got in trouble as a child/adolescent?: appropriate discipline Does patient  have siblings?: Yes Number of Siblings: 3 Description of patient's current relationship with siblings: older half sister, no contact.  2 full sibs: brother and sister, both older.  Good relationships. Did patient suffer any verbal/emotional/physical/sexual abuse as a child?: No Did patient suffer from severe childhood neglect?: No Has patient ever been sexually abused/assaulted/raped as an adolescent or adult?: No Was the patient ever a victim of a crime or a disaster?: No Witnessed domestic violence?: No Has patient been effected by domestic violence as an adult?: No  Education:  Highest grade of school patient has completed: HS diploma.  Pt is [employer] year of college. Currently a student?: No Learning disability?: No  Employment/Work Situation:   Employment situation: Employed Where is patient currently employed?: [employer] How long has patient been employed?: 10 months Patient's job has been impacted by current illness: No What is the longest time patient has a held a job?: 2 years Where was the patient employed at that time?: [employer] Did You Receive Any Psychiatric Treatment/Services While in [employer]?: No Are There Guns or Other Weapons in Your Home?: No  Financial Resources:   Financial resources: Income from employment(roomate contributes towards bills) Does patient have a representative payee or guardian?: No  Alcohol/Substance Abuse:   What has been your use of drugs/alcohol within the last 12 months?: alcohol: 5-6x week, 4-10 shots (partially while working) 2.5 years.  marijuana: daily, 2 blunts per day, 4years.  Denies other drugs. Pt does not wish to stop drinking/drugging and denies desire for treatment at this time. If attempted suicide, did drugs/alcohol play a role in this?: No Alcohol/Substance Abuse Treatment Hx: Denies past history Has alcohol/substance abuse ever caused legal problems?: No  Social Support System:   Patient's Community Support  System: [employer] System: roomate, friend Type of faith/religion: none How does patient's faith help to cope with current illness?: na  Leisure/Recreation:   Leisure and Hobbies: sing, play guitar, read, write  Strengths/Needs:   What is the patient's perception of their strengths?: singing, writing, good with people Patient states they can use these personal strengths during their treatment to contribute to their recovery: spending more time singing, "doing things I enjoy" Patient states these barriers may affect/interfere with their treatment: none Patient states these barriers may affect their return to the community: none-pt doesn't drive but can get rides Other important information patient would like considered in planning for their treatment: na  Discharge Plan:   Currently receiving community mental health services: No Patient states concerns and preferences for aftercare planning are: [facility] Patient states they will know when they are safe and ready for discharge when: when I have a better plan Does patient have access to transportation?: Yes Does patient have financial barriers related to discharge medications?: Yes Patient description of barriers related to discharge medications: no insurance Will patient be returning to same living situation after discharge?: Yes  Summary/Recommendations:   Summary and Recommendations (to be completed by the evaluator): Pt is 22 year old female from [place].  Pt is diagnosed with major depressive disorder and was admitted from the medical floor after a serious suicide attempt by overdose.  Pt reports long term depression with current stressors being her job and trouble getting enrolled at school.  Recommendations for pt include crisis stabilization, therapeutic milieu, attend and participate in groups, medication management, and development of comprehensive mental wellness plan.  [name]. 06/16/2018

## 2018-06-16 NOTE — Progress Notes (Addendum)
Patient ID: Tammy Lowe, female   DOB: [birth year], 22 y.o.   MRN: [id number]  Nursing Progress Note [id number]  Data: Patient requests to be called "Tammy Lowe". Patient presents pleasant and cooperative on the unit. Patient complaint with scheduled medications. Patient denies pain/physical complaints. Patient completed self-inventory sheet and rates depression, hopelessness, and anxiety 4,3,4 respectively. Patient rates their sleep and appetite as good/good respectively. Patient states goal for today is to "make a recovery plan so I can leave".  Patient is seen attending groups and visible in the milieu. Patient currently denies SI/HI/AVH. Patient appears free of withdrawal symptoms at this time. Patient appears to be minimizing her OD attempt and is superficial during interactions.   Action: Patient educated about and provided medication per provider's orders. Patient safety maintained with q15 min safety checks and frequent rounding. Low fall risk precautions in place. Emotional support given. 1:1 interaction and active listening provided. Patient encouraged to attend meals and groups. Patient encouraged to work on treatment plan and goals. Labs, vital signs and patient behavior monitored throughout shift.   Response: Patient remains safe on the unit at this time. Patient is interacting with peers appropriately on the unit. Will continue to support and monitor.

## 2018-06-16 NOTE — Plan of Care (Signed)
  Problem: Health Behavior/Discharge Planning: Goal: Compliance with treatment plan for underlying cause of condition will improve Outcome: Progressing   Problem: Safety: Goal: Periods of time without injury will increase Outcome: Progressing   

## 2018-06-16 NOTE — BHH Group Notes (Signed)
Adult Psychoeducational Group Note  Date:  06/16/2018  Time: 3:15 PM  Group Topic/Focus: Self-Care  Participation Level:  Active  Participation Quality:  Appropriate and Attentive  Affect:  Appropriate  Cognitive:  Alert and Oriented  Insight: Improving  Engagement in Group:  Developing/Improving  Modes of Intervention:  Discussion and Education  Additional Comments:  Patient completed self-care assessment and contributed to the group discussion. Patient states she currently performs yoga for 30 minutes most mornings and would like to continue to do this every day.  Marchelle Folks A Francesco Provencal 06/16/2018 4:00 PM

## 2018-06-16 NOTE — [facility] Suicide Risk Assessment (Signed)
[facility] Admission Suicide Risk Assessment   Nursing information obtained from:  Patient Demographic factors:  Adolescent or young adult Current Mental Status:  Self-harm thoughts Loss Factors:  NA Historical Factors:  Prior suicide attempts, Family history of mental illness or substance abuse Risk Reduction Factors:  Positive coping skills or problem solving skills  Total Time spent with patient: 30 minutes Principal Problem: <principal problem not specified> Diagnosis:   Patient Active Problem List   Diagnosis Date Noted  . MDD (major depressive disorder), recurrent episode, severe (HCC) [F33.2] 06/15/2018  . MDD (major depressive disorder), recurrent severe, without psychosis (HCC) [F33.2] 06/12/2018  . Acetaminophen toxicity [T39.1X1A]   . Overdose [T50.901A] 06/11/2018  . Tylenol overdose [T39.1X1A] 06/11/2018  . Suicide attempt (HCC) [T14.91XA] 06/11/2018  . Transaminitis [R74.0] 06/11/2018  . Hypokalemia [E87.6] 06/11/2018  . Depression [F32.9] 06/11/2018  . Dehydration [E86.0] 06/11/2018  . Hematemesis/vomiting blood [K92.0] 06/11/2018  . Alcohol use [Z78.9] 06/11/2018  . Suicidal ideation [R45.851]    Subjective Data: Patient is seen and examined.  Patient is a 22 year old female with a past psychiatric history significant for major depression, probable posttraumatic stress disorder and cannabis use disorder who presented to the [facility] emergency department on 06/11/2018 after an intentional overdose of Tylenol.  The patient stated that the night prior to admission she took half a bottle of 500 mg Tylenol tablets in a suicide attempt.  She also admitted that she had taken 6 small airplane bottles of fireball.  She stated that she had several bouts of nausea, and had vomited on several times that was of a dark color.  There were concerns that she may have had hematemesis.  Her Tylenol level on admission was 41.  She was admitted to the medical hospital after the Tylenol overdose.   She was given acetylcysteine.  Her liver function enzymes increased during the course of the hospitalization.  This stabilized, and she was transferred to the psychiatric facility for evaluation and stabilization.  The patient admitted that she had been previously treated for depression with Celexa.  She took it approximately a year and a half ago for 3 to 4 months.  She did not feel it was beneficial.  She admitted to a history of trichotillomania.  She states she still pulls her hair out occasionally.  She stated that she drinks 4-5 shots or more a night.  She denied any previous DUIs or substance abuse treatment programs.  She has a family history significant for anxiety and depression in her mother, brother and sister.  She admitted to smoking marijuana.  She also admitted that her father had was deceased, and had been murdered by a Network engineer.  Continued Clinical Symptoms:  Alcohol Use Disorder Identification Test Final Score (AUDIT): 12 The "Alcohol Use Disorders Identification Test", Guidelines for Use in Primary Care, Second Edition.  World Science writer [facility]). Score between 0-7:  no or low risk or alcohol related problems. Score between 8-15:  moderate risk of alcohol related problems. Score between 16-19:  high risk of alcohol related problems. Score 20 or above:  warrants further diagnostic evaluation for alcohol dependence and treatment.   CLINICAL FACTORS:   Depression:   Anhedonia Comorbid alcohol abuse/dependence Hopelessness Impulsivity Insomnia Alcohol/Substance Abuse/Dependencies More than one psychiatric diagnosis   Musculoskeletal: Strength & Muscle Tone: within normal limits Gait & Station: normal Patient leans: N/A  Psychiatric Specialty Exam: Physical Exam  Nursing note and vitals reviewed. Constitutional: She is oriented to person, place, and time. She appears  well-nourished.  HENT:  Head: Normocephalic and atraumatic.  Respiratory: Effort normal.   Neurological: She is alert and oriented to person, place, and time.    ROS  Blood pressure 97/83, pulse 87, temperature 98.4 F (36.9 C), temperature source Oral, resp. rate 18, height 5\' 7"  (1.702 m), weight 59.4 kg, last menstrual period 06/08/2018.Body mass index is 20.52 kg/m.  General Appearance: Casual  Eye Contact:  Fair  Speech:  Normal Rate  Volume:  Normal  Mood:  Anxious and Depressed  Affect:  Congruent  Thought Process:  Coherent and Descriptions of Associations: Intact  Orientation:  Full (Time, Place, and Person)  Thought Content:  Logical  Suicidal Thoughts:  No  Homicidal Thoughts:  No  Memory:  Immediate;   Fair Recent;   Fair Remote;   Fair  Judgement:  Impaired  Insight:  Lacking  Psychomotor Activity:  Normal  Concentration:  Concentration: Fair and Attention Span: Fair  Recall:  [employer] of Knowledge:  Fair  Language:  Fair  Akathisia:  Negative  Handed:  Right  AIMS (if indicated):     Assets:  Communication Skills Desire for Improvement Financial Resources/Insurance Housing Leisure Time Physical Health Resilience  ADL's:  Intact  Cognition:  WNL  Sleep:  Number of Hours: 6.75      COGNITIVE FEATURES THAT CONTRIBUTE TO RISK:  None    SUICIDE RISK:   Mild:  Suicidal ideation of limited frequency, intensity, duration, and specificity.  There are no identifiable plans, no associated intent, mild dysphoria and related symptoms, good self-control (both objective and subjective assessment), few other risk factors, and identifiable protective factors, including available and accessible social support.  PLAN OF CARE: Patient is seen and examined.  Patient is a 22 year old female with a probable past psychiatric history significant for major depression, recurrent, severe without psychotic features, alcohol use disorder, marijuana use disorder and a transaminitis secondary to alcohol and Tylenol.  She will be admitted to the psychiatric hospital.   She will be started on fluoxetine 10 mg p.o. daily.  She will also receive folic acid.  She is on Protonix after her overdose.  Her liver function enzymes were elevated, and these will be checked again this p.m.  She denies current suicidal ideation.  She will be admitted to the unit.  She will be integrated into the milieu.  She will be encouraged to attend coping skills groups.  She will be encouraged to deal with her substance abuse issues.  She will be monitored for withdrawal syndromes.  I certify that inpatient services furnished can reasonably be expected to improve the patient's condition.   Antonieta Pert, MD 06/16/2018, 11:24 AM

## 2018-06-16 NOTE — Progress Notes (Signed)
Adult Psychoeducational Group Note  Date:  06/16/2018 Time:  6:53 PM  Group Topic/Focus:  Goals Group:   The focus of this group is to help patients establish daily goals to achieve during treatment and discuss how the patient can incorporate goal setting into their daily lives to aide in recovery.  Participation Level:  Active  Participation Quality:  Attentive  Affect:  Appropriate  Cognitive:  Alert  Insight: Appropriate  Engagement in Group:  Improving  Modes of Intervention:  Discussion  Additional Comments:  Pt attended group and participated in discussion.  Jaun Galluzzo R Odessa Nishi 06/16/2018, 6:53 PM

## 2018-06-16 NOTE — H&P (Signed)
Psychiatric Admission Assessment Adult  Patient Identification: Tammy Lowe MRN:  [id number] Date of Evaluation:  06/16/2018 Chief Complaint:  MDD RECURRENT SEVERE Principal Diagnosis: <principal problem not specified> Diagnosis:   Patient Active Problem List   Diagnosis Date Noted  . MDD (major depressive disorder), recurrent episode, severe ([place]) [F33.2] 06/15/2018  . MDD (major depressive disorder), recurrent severe, without psychosis ([place]) [F33.2] 06/12/2018  . Acetaminophen toxicity [T39.1X1A]   . Overdose [T50.901A] 06/11/2018  . Tylenol overdose [T39.1X1A] 06/11/2018  . Suicide attempt ([place]) [T14.91XA] 06/11/2018  . Transaminitis [R74.0] 06/11/2018  . Hypokalemia [E87.6] 06/11/2018  . Depression [F32.9] 06/11/2018  . Dehydration [E86.0] 06/11/2018  . Hematemesis/vomiting blood [K92.0] 06/11/2018  . Alcohol use [Z78.9] 06/11/2018  . Suicidal ideation [R45.851]    History of Present Illness: Patient is seen and examined.  Patient is a 22 year old female with a past psychiatric history significant for major depression, probable posttraumatic stress disorder and cannabis use disorder who presented to the [facility] emergency department on 06/11/2018 after an intentional overdose of Tylenol.  The patient stated that the night prior to admission she took half a bottle of 500 mg Tylenol tablets in a suicide attempt.  She also admitted that she had taken 6 small airplane bottles of fireball.  She stated that she had several bouts of nausea, and had vomited on several times that was of a dark color.  There were concerns that she may have had hematemesis.  Her Tylenol level on admission was 41.  She was admitted to the medical hospital after the Tylenol overdose.  She was given acetylcysteine.  Her liver function enzymes increased during the course of the hospitalization.  This stabilized, and she was transferred to the psychiatric facility for evaluation and stabilization.  The patient  admitted that she had been previously treated for depression with Celexa.  She took it approximately a year and a half ago for 3 to 4 months.  She did not feel it was beneficial.  She admitted to a history of trichotillomania.  She states she still pulls her hair out occasionally.  She stated that she drinks 4-5 shots or more a night.  She denied any previous DUIs or substance abuse treatment programs.  She has a family history significant for anxiety and depression in her mother, brother and sister.  She admitted to smoking marijuana.  She also admitted that her father had was deceased, and had been murdered by a Industrial/product designer.  Associated Signs/Symptoms: Depression Symptoms:  depressed mood, anhedonia, insomnia, psychomotor retardation, fatigue, feelings of worthlessness/guilt, difficulty concentrating, hopelessness, suicidal thoughts with specific plan, suicidal attempt, anxiety, panic attacks, loss of energy/fatigue, disturbed sleep, (Hypo) Manic Symptoms:  Impulsivity, Labiality of Mood, Anxiety Symptoms:  Excessive Worry, Psychotic Symptoms:  Denied PTSD Symptoms: Negative Total Time spent with patient: 30 minutes  Past Psychiatric History: She was previously treated for depression approximately 1-1/2 years ago.  Was on Celexa for about 3 to 4 months.  It was not effective.  She also has a history of trichotillomania as an adolescent.  She still pulls her hair occasionally.  Is the patient at risk to self? Yes.    Has the patient been a risk to self in the past 6 months? Yes.    Has the patient been a risk to self within the distant past? No.  Is the patient a risk to others? No.  Has the patient been a risk to others in the past 6 months? No.  Has the patient been  a risk to others within the distant past? No.   Prior Inpatient Therapy:   Prior Outpatient Therapy:    Alcohol Screening: 1. How often do you have a drink containing alcohol?: 4 or more times a week 2. How many  drinks containing alcohol do you have on a typical day when you are drinking?: 5 or 6 3. How often do you have six or more drinks on one occasion?: Weekly AUDIT-C Score: 9 4. How often during the last year have you found that you were not able to stop drinking once you had started?: Less than monthly 5. How often during the last year have you failed to do what was normally expected from you becasue of drinking?: Never 6. How often during the last year have you needed a first drink in the morning to get yourself going after a heavy drinking session?: Never 7. How often during the last year have you had a feeling of guilt of remorse after drinking?: Less than monthly 8. How often during the last year have you been unable to remember what happened the night before because you had been drinking?: Less than monthly 9. Have you or someone else been injured as a result of your drinking?: No 10. Has a relative or friend or a doctor or another health worker been concerned about your drinking or suggested you cut down?: No Alcohol Use Disorder Identification Test Final Score (AUDIT): 12 Intervention/Follow-up: Alcohol Education Substance Abuse History in the last 12 months:  Yes.   Consequences of Substance Abuse: Negative Previous Psychotropic Medications: Yes  Psychological Evaluations: Yes  Past Medical History:  Past Medical History:  Diagnosis Date  . Depression 06/11/2018    Past Surgical History:  Procedure Laterality Date  . APPENDECTOMY     Family History: History reviewed. No pertinent family history. Family Psychiatric  History: Patient stated that her mother, brother and sister have depression and anxiety Tobacco Screening: Have you used any form of tobacco in the last 30 days? (Cigarettes, Smokeless Tobacco, Cigars, and/or Pipes): Yes Tobacco use, Select all that apply: 4 or less cigarettes per day Are you interested in Tobacco Cessation Medications?: No, patient refused Counseled  patient on smoking cessation including recognizing danger situations, developing coping skills and basic information about quitting provided: Refused/Declined practical counseling Social History:  Social History   Substance and Sexual Activity  Alcohol Use Yes   Comment: daily     Social History   Substance and Sexual Activity  Drug Use Yes  . Types: Marijuana    Additional Social History: Marital status: Single Are you sexually active?: Yes What is your sexual orientation?: heterosexual Has your sexual activity been affected by drugs, alcohol, medication, or emotional stress?: no Does patient have children?: No                         Allergies:  No Known Allergies Lab Results:  Results for orders placed or performed during the hospital encounter of 06/11/18 (from the past 48 hour(s))  Comprehensive metabolic panel     Status: Abnormal   Collection Time: 06/15/18  5:07 AM  Result Value Ref Range   Sodium 139 135 - 145 mmol/L   Potassium 3.6 3.5 - 5.1 mmol/L   Chloride 106 98 - 111 mmol/L   CO2 26 22 - 32 mmol/L   Glucose, Bld 96 70 - 99 mg/dL   BUN 9 6 - 20 mg/dL   Creatinine, Ser 0.68 0.44 -  1.00 mg/dL   Calcium 9.8 8.9 - 10.3 mg/dL   Total Protein 7.1 6.5 - 8.1 g/dL   Albumin 4.1 3.5 - 5.0 g/dL   AST 53 (H) 15 - 41 U/L   ALT 310 (H) 0 - 44 U/L   Alkaline Phosphatase 52 38 - 126 U/L   Total Bilirubin 0.5 0.3 - 1.2 mg/dL   GFR calc non Af Amer >60 >60 mL/min   GFR calc Af Amer >60 >60 mL/min    Comment: (NOTE) The eGFR has been calculated using the CKD EPI equation. This calculation has not been validated in all clinical situations. eGFR's persistently <60 mL/min signify possible Chronic Kidney Disease.    Anion gap 7 5 - 15    Comment: Performed at [facility], [place] [address], [place], [place] [address]  Protime-INR     Status: None   Collection Time: 06/15/18  5:07 AM  Result Value Ref Range   Prothrombin Time 12.2 11.4 - 15.2  seconds   INR 0.91     Comment: Performed at [facility], [place] [address], [place], [place] [address]    Blood Alcohol level:  Lab Results  Component Value Date   ETH <10 03/88/8280    Metabolic Disorder Labs:  No results found for: HGBA1C, MPG No results found for: PROLACTIN No results found for: CHOL, TRIG, HDL, CHOLHDL, VLDL, LDLCALC  Current Medications: Current Facility-Administered Medications  Medication Dose Route Frequency Provider Last Rate Last Dose  . alum & mag hydroxide-simeth (MAALOX/MYLANTA) 200-200-20 MG/5ML suspension 30 mL  30 mL Oral Q4H PRN Elmarie Shiley A, NP      . FLUoxetine (PROZAC) capsule 10 mg  10 mg Oral Daily Sharma Covert, MD   10 mg at 06/16/18 1123  . folic acid (FOLVITE) tablet 1 mg  1 mg Oral Daily Elmarie Shiley A, NP   1 mg at 06/16/18 0802  . loratadine (CLARITIN) tablet 10 mg  10 mg Oral Daily Elmarie Shiley A, NP   10 mg at 06/16/18 0803  . multivitamin with minerals tablet 1 tablet  1 tablet Oral Daily Niel Hummer, NP   1 tablet at 06/16/18 0802  . norgestrel-ethinyl estradiol (LO/OVRAL,CRYSELLE) 0.3-30 MG-MCG per tablet 1 tablet  1 tablet Oral Daily Elmarie Shiley A, NP      . pantoprazole (PROTONIX) EC tablet 40 mg  40 mg Oral Daily Elmarie Shiley A, NP   40 mg at 06/16/18 0802  . senna-docusate (Senokot-S) tablet 1 tablet  1 tablet Oral QHS PRN Niel Hummer, NP      . traZODone (DESYREL) tablet 50 mg  50 mg Oral QHS PRN Niel Hummer, NP       PTA Medications: Medications Prior to Admission  Medication Sig Dispense Refill Last Dose  . folic acid (FOLVITE) 1 MG tablet Take 1 tablet (1 mg total) by mouth daily. 30 tablet 0   . loratadine (CLARITIN) 10 MG tablet Take 10 mg by mouth daily.   Past Week at Unknown time  . Multiple Vitamin (MULTIVITAMIN WITH MINERALS) TABS tablet Take 1 tablet by mouth daily.     . norgestrel-ethinyl estradiol (LO/OVRAL,CRYSELLE) 0.3-30 MG-MCG tablet Take 1 tablet by mouth daily.   06/10/2018  at Unknown time  . pantoprazole (PROTONIX) 40 MG tablet Take 1 tablet (40 mg total) by mouth daily. 30 tablet 0   . thiamine 100 MG tablet Take 1 tablet (100 mg total) by mouth daily.  Musculoskeletal: Strength & Muscle Tone: within normal limits Gait & Station: normal Patient leans: Right  Psychiatric Specialty Exam: Physical Exam  Nursing note and vitals reviewed. Constitutional: She is oriented to person, place, and time. She appears well-developed and well-nourished.  HENT:  Head: Normocephalic and atraumatic.  Respiratory: Effort normal.  Neurological: She is alert and oriented to person, place, and time.    ROS  Blood pressure 97/83, pulse 87, temperature 98.4 F (36.9 C), temperature source Oral, resp. rate 18, height 5' 7"  (1.702 m), weight 59.4 kg, last menstrual period 06/08/2018.Body mass index is 20.52 kg/m.  General Appearance: Casual  Eye Contact:  Fair  Speech:  Normal Rate  Volume:  Normal  Mood:  Anxious  Affect:  Congruent  Thought Process:  Coherent and Descriptions of Associations: Intact  Orientation:  Full (Time, Place, and Person)  Thought Content:  Logical  Suicidal Thoughts:  No  Homicidal Thoughts:  No  Memory:  Immediate;   Fair Recent;   Fair Remote;   Fair  Judgement:  Impaired  Insight:  Lacking  Psychomotor Activity:  Normal  Concentration:  Concentration: Fair and Attention Span: Fair  Recall:  [employer] of Knowledge:  Fair  Language:  Good  Akathisia:  Negative  Handed:  Right  AIMS (if indicated):     Assets:  Communication Skills Desire for Improvement Financial Resources/Insurance Housing Physical Health Resilience Social Support Talents/Skills  ADL's:  Intact  Cognition:  WNL  Sleep:  Number of Hours: 6.75    Treatment Plan Summary: Daily contact with patient to assess and evaluate symptoms and progress in treatment, Medication management and Plan : Patient is seen and examined.  Patient is a 22 year old female  with a probable past psychiatric history significant for major depression, recurrent, severe without psychotic features, alcohol use disorder, marijuana use disorder and a transaminitis secondary to alcohol and Tylenol.  She will be admitted to the psychiatric hospital.  She will be started on fluoxetine 10 mg p.o. daily.  She will also receive folic acid.  She is on Protonix after her overdose.  Her liver function enzymes were elevated, and these will be checked again this p.m.  She denies current suicidal ideation.  She will be admitted to the unit.  She will be integrated into the milieu.  She will be encouraged to attend coping skills groups.  She will be encouraged to deal with her substance abuse issues.  She will be monitored for withdrawal syndromes.  Observation Level/Precautions:  15 minute checks  Laboratory:  Chemistry Profile  Psychotherapy:    Medications:    Consultations:    Discharge Concerns:    Estimated LOS:  Other:     Physician Treatment Plan for Primary Diagnosis: <principal problem not specified> Long Term Goal(s): Improvement in symptoms so as ready for discharge  Short Term Goals: Ability to identify changes in lifestyle to reduce recurrence of condition will improve, Ability to verbalize feelings will improve, Ability to disclose and discuss suicidal ideas, Ability to demonstrate self-control will improve, Ability to identify and develop effective coping behaviors will improve, Ability to maintain clinical measurements within normal limits will improve and Ability to identify triggers associated with substance abuse/mental health issues will improve  Physician Treatment Plan for Secondary Diagnosis: Active Problems:   MDD (major depressive disorder), recurrent episode, severe ([place])  Long Term Goal(s): Improvement in symptoms so as ready for discharge  Short Term Goals: Ability to identify changes in lifestyle to reduce recurrence of condition  will improve, Ability to  verbalize feelings will improve, Ability to disclose and discuss suicidal ideas, Ability to demonstrate self-control will improve, Ability to identify and develop effective coping behaviors will improve, Ability to maintain clinical measurements within normal limits will improve and Ability to identify triggers associated with substance abuse/mental health issues will improve  I certify that inpatient services furnished can reasonably be expected to improve the patient's condition.    Sharma Covert, MD 9/10/20191:16 PM

## 2018-06-17 MED ORDER — FLUOXETINE HCL 20 MG PO CAPS
20.0000 mg | ORAL_CAPSULE | Freq: Every day | ORAL | Status: DC
  Administered 2018-06-18: 20 mg via ORAL
  Filled 2018-06-17: qty 7
  Filled 2018-06-17 (×2): qty 1
  Filled 2018-06-17: qty 7

## 2018-06-17 MED ORDER — FLUOXETINE HCL 10 MG PO CAPS
10.0000 mg | ORAL_CAPSULE | Freq: Once | ORAL | Status: AC
  Administered 2018-06-17: 10 mg via ORAL
  Filled 2018-06-17: qty 1

## 2018-06-17 NOTE — Progress Notes (Signed)
Pt reports her day was great and she voiced no needs or complaints this evening.  She has been in the dayroom talking with peers and watching TV.  Pt makes her needs known to staff.  She initially did not want a sleep aid for night time, but changed her mind and took Trazodone prior to going to bed.  Pt plans to return home at discharge.  Support and encouragement offered.  Discharge plans are in process.  Safety maintained with q15 minute checks.

## 2018-06-17 NOTE — Progress Notes (Signed)
Data: Patient presented today with animated affect and is seen interacting with her peers in the dayroom. Patient reported that she was having a "great day" and was compliant with meds. Patient denies pain/physical complaints. Patient states goal for today is to "get medications straight and get a referral to a psychologist". Patient was seen attending groups and visible in the milieu. Patient currently denies SI/HI/AVH.   Action: Patient educated about and provided medication per provider's orders. Patient safety maintained with q15 min safety checks and frequent rounding. Low fall risk precautions in place. Emotional support given. 1:1 interaction and active listening provided. Patient encouraged to attend meals and groups. Patient encouraged to work on treatment plan and goals. Labs, vital signs and patient behavior monitored throughout shift.   Response: Patient remains safe on the unit at this time. Patient is interacting with peers appropriately on the unit. Will continue to support and monitor.

## 2018-06-17 NOTE — Progress Notes (Signed)
[facility] MD Progress Note  06/17/2018 11:11 AM ENGLISH ACKER  MRN:  [id number] Subjective: Patient is seen and examined.  Patient is a 22 year old female with a past psychiatric history significant for major depression, posttraumatic stress disorder, alcohol use disorder as well as cannabis use disorder.  She is seen in follow-up.  Her repeat liver function enzymes are improving.  Her AST is down to 33, and her ALT is down to 201.  We had a long frank discussion today about the seriousness of her suicide attempt.  She has been very pleasant during the entire hospitalization, but when we discussed things today she became tearful and realized that her depression is going on.  She denied any current suicidal ideation.  She denied any problems with the fluoxetine. Principal Problem: <principal problem not specified> Diagnosis:   Patient Active Problem List   Diagnosis Date Noted  . MDD (major depressive disorder), recurrent episode, severe (HCC) [F33.2] 06/15/2018  . MDD (major depressive disorder), recurrent severe, without psychosis (HCC) [F33.2] 06/12/2018  . Acetaminophen toxicity [T39.1X1A]   . Overdose [T50.901A] 06/11/2018  . Tylenol overdose [T39.1X1A] 06/11/2018  . Suicide attempt (HCC) [T14.91XA] 06/11/2018  . Transaminitis [R74.0] 06/11/2018  . Hypokalemia [E87.6] 06/11/2018  . Depression [F32.9] 06/11/2018  . Dehydration [E86.0] 06/11/2018  . Hematemesis/vomiting blood [K92.0] 06/11/2018  . Alcohol use [Z78.9] 06/11/2018  . Suicidal ideation [R45.851]    Total Time spent with patient: 15 minutes  Past Psychiatric History: See admission H&P  Past Medical History:  Past Medical History:  Diagnosis Date  . Depression 06/11/2018    Past Surgical History:  Procedure Laterality Date  . APPENDECTOMY     Family History: History reviewed. No pertinent family history. Family Psychiatric  History: See admission H&P Social History:  Social History   Substance and Sexual Activity   Alcohol Use Yes   Comment: daily     Social History   Substance and Sexual Activity  Drug Use Yes  . Types: Marijuana    Social History   Socioeconomic History  . Marital status: Single    Spouse name: Not on file  . Number of children: Not on file  . Years of education: Not on file  . Highest education level: Not on file  Occupational History  . Not on file  Social Needs  . Financial resource strain: Not on file  . Food insecurity:    Worry: Not on file    Inability: Not on file  . Transportation needs:    Medical: Not on file    Non-medical: Not on file  Tobacco Use  . Smoking status: Current Some Day Smoker    Packs/day: 0.25    Years: 0.00    Pack years: 0.00    Types: Cigarettes  . Smokeless tobacco: Never Used  Substance and Sexual Activity  . Alcohol use: Yes    Comment: daily  . Drug use: Yes    Types: Marijuana  . Sexual activity: Yes  Lifestyle  . Physical activity:    Days per week: Not on file    Minutes per session: Not on file  . Stress: Not on file  Relationships  . Social connections:    Talks on phone: Not on file    Gets together: Not on file    Attends religious service: Not on file    Active member of club or organization: Not on file    Attends meetings of clubs or organizations: Not on file    Relationship  status: Not on file  Other Topics Concern  . Not on file  Social History Narrative  . Not on file   Additional Social History:                         Sleep: Fair  Appetite:  Good  Current Medications: Current Facility-Administered Medications  Medication Dose Route Frequency Provider Last Rate Last Dose  . alum & mag hydroxide-simeth (MAALOX/MYLANTA) 200-200-20 MG/5ML suspension 30 mL  30 mL Oral Q4H PRN Fransisca Kaufmann A, NP      . FLUoxetine (PROZAC) capsule 10 mg  10 mg Oral Once Antonieta Pert, MD      . Melene Muller ON 06/18/2018] FLUoxetine (PROZAC) capsule 20 mg  20 mg Oral Daily Antonieta Pert, MD      .  folic acid (FOLVITE) tablet 1 mg  1 mg Oral Daily Fransisca Kaufmann A, NP   1 mg at 06/17/18 0834  . loratadine (CLARITIN) tablet 10 mg  10 mg Oral Daily Fransisca Kaufmann A, NP   10 mg at 06/17/18 0834  . multivitamin with minerals tablet 1 tablet  1 tablet Oral Daily Thermon Leyland, NP   1 tablet at 06/17/18 0834  . norgestrel-ethinyl estradiol (LO/OVRAL,CRYSELLE) 0.3-30 MG-MCG per tablet 1 tablet  1 tablet Oral Daily Fransisca Kaufmann A, NP      . pantoprazole (PROTONIX) EC tablet 40 mg  40 mg Oral Daily Fransisca Kaufmann A, NP   40 mg at 06/17/18 0834  . senna-docusate (Senokot-S) tablet 1 tablet  1 tablet Oral QHS PRN Thermon Leyland, NP      . traZODone (DESYREL) tablet 50 mg  50 mg Oral QHS PRN Thermon Leyland, NP   50 mg at 06/16/18 2202    Lab Results:  Results for orders placed or performed during the hospital encounter of 06/15/18 (from the past 48 hour(s))  Hepatic function panel     Status: Abnormal   Collection Time: 06/16/18  6:30 PM  Result Value Ref Range   Total Protein 8.0 6.5 - 8.1 g/dL   Albumin 4.7 3.5 - 5.0 g/dL   AST 33 15 - 41 U/L   ALT 201 (H) 0 - 44 U/L   Alkaline Phosphatase 57 38 - 126 U/L   Total Bilirubin 0.8 0.3 - 1.2 mg/dL   Bilirubin, Direct 0.1 0.0 - 0.2 mg/dL   Indirect Bilirubin 0.7 0.3 - 0.9 mg/dL    Comment: Performed at [facility], [address], [place], [place] [address]    Blood Alcohol level:  Lab Results  Component Value Date   ETH <10 06/11/2018    Metabolic Disorder Labs: No results found for: HGBA1C, MPG No results found for: PROLACTIN No results found for: CHOL, TRIG, HDL, CHOLHDL, VLDL, LDLCALC  Physical Findings: AIMS: Facial and Oral Movements Muscles of Facial Expression: None, normal Lips and Perioral Area: None, normal Jaw: None, normal Tongue: None, normal,Extremity Movements Upper (arms, wrists, hands, fingers): None, normal Lower (legs, knees, ankles, toes): None, normal, Trunk Movements Neck, shoulders, hips: None,  normal, Overall Severity Severity of abnormal movements (highest score from questions above): None, normal Incapacitation due to abnormal movements: None, normal Patient's awareness of abnormal movements (rate only patient's report): No Awareness, Dental Status Current problems with teeth and/or dentures?: No Does patient usually wear dentures?: No  CIWA:  CIWA-Ar Total: 0 COWS:     Musculoskeletal: Strength & Muscle Tone: within normal limits Gait &  Station: normal Patient leans: N/A  Psychiatric Specialty Exam: Physical Exam  Nursing note and vitals reviewed. Constitutional: She is oriented to person, place, and time. She appears well-developed and well-nourished.  HENT:  Head: Normocephalic and atraumatic.  Respiratory: Effort normal.  Neurological: She is alert and oriented to person, place, and time.    ROS  Blood pressure 120/83, pulse 78, temperature 97.7 F (36.5 C), temperature source Oral, resp. rate 16, height 5\' 7"  (1.702 m), weight 59.4 kg, last menstrual period 06/08/2018.Body mass index is 20.52 kg/m.  General Appearance: Casual  Eye Contact:  Good  Speech:  Normal Rate  Volume:  Normal  Mood:  Depressed  Affect:  Congruent  Thought Process:  Coherent and Descriptions of Associations: Intact  Orientation:  Full (Time, Place, and Person)  Thought Content:  Logical  Suicidal Thoughts:  No  Homicidal Thoughts:  No  Memory:  Immediate;   Fair Recent;   Fair Remote;   Fair  Judgement:  Intact  Insight:  Fair  Psychomotor Activity:  Normal  Concentration:  Concentration: Fair and Attention Span: Fair  Recall:  [employer] of Knowledge:  Fair  Language:  Fair  Akathisia:  Negative  Handed:  Right  AIMS (if indicated):     Assets:  Communication Skills Desire for Improvement Housing Physical Health Resilience Social Support  ADL's:  Intact  Cognition:  WNL  Sleep:  Number of Hours: 6.5     Treatment Plan Summary: Daily contact with patient to  assess and evaluate symptoms and progress in treatment, Medication management and Plan : Patient is seen and examined.  Patient is a 22 year old female with the above-stated past psychiatric history seen in follow-up.  #1 depression/PTSD-increase fluoxetine to 20 mg p.o. daily.  #2-alcohol use disorder/cannabis use disorder-continue to encourage patient to stop drinking and using marijuana.  #3-hepatitis secondary to Tylenol overdose and alcohol-liver function enzymes continue to improve.  #4 discharge planning-if she continues to improve from a mood standpoint we will consider discharge in 1 to 2 days.  Antonieta Pert, MD 06/17/2018, 11:11 AM

## 2018-06-17 NOTE — BH Assessment (Signed)
[facility] Mental Health Association Group Therapy 06/17/2018 1:15pm  Type of Therapy: Mental Health Association Presentation  Participation Level: Active  Participation Quality: Attentive  Affect: Appropriate  Cognitive: Oriented  Insight: Developing/Improving  Engagement in Therapy: Engaged  Modes of Intervention: Discussion, Education and Socialization  Summary of Progress/Problems: Mental Health Association (MHA) Speaker came to talk about his personal journey with mental health. The pt processed ways by which to relate to the speaker. MHA speaker provided handouts and educational information pertaining to groups and services offered by the [facility]. Pt was engaged in speaker's presentation and was receptive to resources provided.    Rona Ravens, LCSW 06/17/2018 9:25 AM

## 2018-06-17 NOTE — [facility] Suicide Risk Assessment (Signed)
[facility] INPATIENT:  Family/Significant Other Suicide Prevention Education  Suicide Prevention Education:  Education Completed; Tammy Lowe, roommate, [phone], has been identified by the patient as the family member/significant other with whom the patient will be residing, and identified as the person(s) who will aid the patient in the event of a mental health crisis (suicidal ideations/suicide attempt).  With written consent from the patient, the family member/significant other has been provided the following suicide prevention education, prior to the and/or following the discharge of the patient.  The suicide prevention education provided includes the following:  Suicide risk factors  Suicide prevention and interventions  National Suicide Hotline telephone number  [facility] assessment telephone number  [facility] Emergency Assistance 911  [facility] and/or Residential Mobile Crisis Unit telephone number  Request made of family/significant other to:  Remove weapons (e.g., guns, rifles, knives), all items previously/currently identified as safety concern.  No guns in the home, per [name].  Remove drugs/medications (over-the-counter, prescriptions, illicit drugs), all items previously/currently identified as a safety concern.  The family member/significant other verbalizes understanding of the suicide prevention education information provided.  The family member/significant other agrees to remove the items of safety concern listed above.  Tammy Lowe reports that "life" is getting to pt: school, work, struggling to pay bills.  Just stressed out.  He was getting ready to go out when she took the overdose and just decided to check on her before he left--she did not tell him about the pills that she took.  He does see her daily and will check in with her moving forward.   [name], LCSW 06/17/2018, 2:10 PM

## 2018-06-17 NOTE — Tx Team (Signed)
Interdisciplinary Treatment and Diagnostic Plan Update  06/17/2018 Time of Session: 0948 Tammy Lowe MRN: [id number]  Principal Diagnosis: <principal problem not specified>  Secondary Diagnoses: Active Problems:   MDD (major depressive disorder), recurrent episode, severe (HCC)   Current Medications:  Current Facility-Administered Medications  Medication Dose Route Frequency Provider Last Rate Last Dose  . alum & mag hydroxide-simeth (MAALOX/MYLANTA) 200-200-20 MG/5ML suspension 30 mL  30 mL Oral Q4H PRN Thermon Leyland, NP      . Melene Muller ON 06/18/2018] FLUoxetine (PROZAC) capsule 20 mg  20 mg Oral Daily Antonieta Pert, MD      . folic acid (FOLVITE) tablet 1 mg  1 mg Oral Daily Fransisca Kaufmann A, NP   1 mg at 06/17/18 0834  . loratadine (CLARITIN) tablet 10 mg  10 mg Oral Daily Fransisca Kaufmann A, NP   10 mg at 06/17/18 0834  . multivitamin with minerals tablet 1 tablet  1 tablet Oral Daily Thermon Leyland, NP   1 tablet at 06/17/18 0834  . norgestrel-ethinyl estradiol (LO/OVRAL,CRYSELLE) 0.3-30 MG-MCG per tablet 1 tablet  1 tablet Oral Daily Fransisca Kaufmann A, NP      . pantoprazole (PROTONIX) EC tablet 40 mg  40 mg Oral Daily Fransisca Kaufmann A, NP   40 mg at 06/17/18 0834  . senna-docusate (Senokot-S) tablet 1 tablet  1 tablet Oral QHS PRN Thermon Leyland, NP      . traZODone (DESYREL) tablet 50 mg  50 mg Oral QHS PRN Thermon Leyland, NP   50 mg at 06/16/18 2202   PTA Medications: Medications Prior to Admission  Medication Sig Dispense Refill Last Dose  . folic acid (FOLVITE) 1 MG tablet Take 1 tablet (1 mg total) by mouth daily. 30 tablet 0   . loratadine (CLARITIN) 10 MG tablet Take 10 mg by mouth daily.   Past Week at Unknown time  . Multiple Vitamin (MULTIVITAMIN WITH MINERALS) TABS tablet Take 1 tablet by mouth daily.     . norgestrel-ethinyl estradiol (LO/OVRAL,CRYSELLE) 0.3-30 MG-MCG tablet Take 1 tablet by mouth daily.   06/10/2018 at Unknown time  . pantoprazole (PROTONIX) 40 MG  tablet Take 1 tablet (40 mg total) by mouth daily. 30 tablet 0   . thiamine 100 MG tablet Take 1 tablet (100 mg total) by mouth daily.       Patient Stressors: Medication change or noncompliance Substance abuse  Patient Strengths: Ability for insight Average or above average intelligence Capable of independent living General fund of knowledge  Treatment Modalities: Medication Management, Group therapy, Case management,  1 to 1 session with clinician, Psychoeducation, Recreational therapy.   Physician Treatment Plan for Primary Diagnosis: <principal problem not specified> Long Term Goal(s): Improvement in symptoms so as ready for discharge Improvement in symptoms so as ready for discharge   Short Term Goals: Ability to identify changes in lifestyle to reduce recurrence of condition will improve Ability to verbalize feelings will improve Ability to disclose and discuss suicidal ideas Ability to demonstrate self-control will improve Ability to identify and develop effective coping behaviors will improve Ability to maintain clinical measurements within normal limits will improve Ability to identify triggers associated with substance abuse/mental health issues will improve Ability to identify changes in lifestyle to reduce recurrence of condition will improve Ability to verbalize feelings will improve Ability to disclose and discuss suicidal ideas Ability to demonstrate self-control will improve Ability to identify and develop effective coping behaviors will improve Ability to maintain clinical measurements within  normal limits will improve Ability to identify triggers associated with substance abuse/mental health issues will improve  Medication Management: Evaluate patient's response, side effects, and tolerance of medication regimen.  Therapeutic Interventions: 1 to 1 sessions, Unit Group sessions and Medication administration.  Evaluation of Outcomes: Progressing  Physician  Treatment Plan for Secondary Diagnosis: Active Problems:   MDD (major depressive disorder), recurrent episode, severe (HCC)  Long Term Goal(s): Improvement in symptoms so as ready for discharge Improvement in symptoms so as ready for discharge   Short Term Goals: Ability to identify changes in lifestyle to reduce recurrence of condition will improve Ability to verbalize feelings will improve Ability to disclose and discuss suicidal ideas Ability to demonstrate self-control will improve Ability to identify and develop effective coping behaviors will improve Ability to maintain clinical measurements within normal limits will improve Ability to identify triggers associated with substance abuse/mental health issues will improve Ability to identify changes in lifestyle to reduce recurrence of condition will improve Ability to verbalize feelings will improve Ability to disclose and discuss suicidal ideas Ability to demonstrate self-control will improve Ability to identify and develop effective coping behaviors will improve Ability to maintain clinical measurements within normal limits will improve Ability to identify triggers associated with substance abuse/mental health issues will improve     Medication Management: Evaluate patient's response, side effects, and tolerance of medication regimen.  Therapeutic Interventions: 1 to 1 sessions, Unit Group sessions and Medication administration.  Evaluation of Outcomes: Progressing   RN Treatment Plan for Primary Diagnosis: <principal problem not specified> Long Term Goal(s): Knowledge of disease and therapeutic regimen to maintain health will improve  Short Term Goals: Ability to identify and develop effective coping behaviors will improve and Compliance with prescribed medications will improve  Medication Management: RN will administer medications as ordered by provider, will assess and evaluate patient's response and provide education to  patient for prescribed medication. RN will report any adverse and/or side effects to prescribing provider.  Therapeutic Interventions: 1 on 1 counseling sessions, Psychoeducation, Medication administration, Evaluate responses to treatment, Monitor vital signs and CBGs as ordered, Perform/monitor CIWA, COWS, AIMS and Fall Risk screenings as ordered, Perform wound care treatments as ordered.  Evaluation of Outcomes: Progressing   LCSW Treatment Plan for Primary Diagnosis: <principal problem not specified> Long Term Goal(s): Safe transition to appropriate next level of care at discharge, Engage patient in therapeutic group addressing interpersonal concerns.  Short Term Goals: Engage patient in aftercare planning with referrals and resources, Increase social support and Increase skills for wellness and recovery  Therapeutic Interventions: Assess for all discharge needs, 1 to 1 time with Social worker, Explore available resources and support systems, Assess for adequacy in community support network, Educate family and significant other(s) on suicide prevention, Complete Psychosocial Assessment, Interpersonal group therapy.  Evaluation of Outcomes: Progressing   Progress in Treatment: Attending groups: Yes. Participating in groups: Yes. Taking medication as prescribed: Yes. Toleration medication: Yes. Family/Significant other contact made: Yes, individual(s) contacted:  roomate Patient understands diagnosis: Yes. Discussing patient identified problems/goals with staff: Yes. Medical problems stabilized or resolved: Yes. Denies suicidal/homicidal ideation: Yes. Issues/concerns per patient self-inventory: No. Other: none  New problem(s) identified: No, Describe:  none  New Short Term/Long Term Goal(s):  Patient Goals:  Get meds, get set up with a psychologist.  Discharge Plan or Barriers:   Reason for Continuation of Hospitalization: Depression Medication stabilization  Estimated  Length of Stay: 1-3 days.  Attendees: Patient: Tammy Lowe 06/17/2018   Physician: Dr.  Jola Babinski, MD 06/17/2018   Nursing: Thelma Comp, RN 06/17/2018   RN Care Manager: 06/17/2018   Social Worker: Daleen Squibb, LCSW 06/17/2018   Recreational Therapist:  06/17/2018   Other:  06/17/2018   Other:  06/17/2018  Other: 06/17/2018        Scribe for Treatment Team: [name], LCSW 06/17/2018 2:39 PM

## 2018-06-17 NOTE — Progress Notes (Signed)
Recreation Therapy Notes  Date: 9.11.19 Time: 0930 Location: [address] Dayroom  Group Topic: Stress Management  Goal Area(s) Addresses:  Patient will verbalize importance of using healthy stress management.  Patient will identify positive emotions associated with healthy stress management.   Intervention: Stress Management  Activity :  Guided Imagery.  LRT introduced the stress management technique of guided imagery.  LRT read a script for patients to envision seeing a starry sky at night.  Patients were to follow along as script was read to engage in activity.  Education:  Stress Management, Discharge Planning.   Education Outcome: Acknowledges edcuation/In group clarification offered/Needs additional education  Clinical Observations/Feedback: Pt did not attend group.    Jaiveer Panas, LRT/CTRS         Emerlyn Mehlhoff A 06/17/2018 12:07 PM 

## 2018-06-17 NOTE — Therapy (Signed)
Occupational Therapy Group Note  Date:  06/17/2018 Time:  2:34 PM  Group Topic/Focus:  Self Esteem Action Plan:   The focus of this group is to help patients create a plan to continue to build self-esteem after discharge.  Participation Level:  Active  Participation Quality:  Appropriate  Affect:  Appropriate  Cognitive:  Appropriate  Insight: Improving  Engagement in Group:  Engaged  Modes of Intervention:  Activity, Discussion, Education and Socialization  Additional Comments:    S: "My self esteem is pretty good"   O:Education given on self esteem, its definition, and how it becomes negative vs positive. Self esteem education given on its relation to MH. Pt encouraged to contribute in discussion and brainstorm. Self esteem activity completed where pt is to name a positive word for each letter of the alphabet (A-Z). Pt then to complete coat of arms activity to encompass roles, values, goals, and favorite qualities. Coloring encouraged within this activity. Positive affirmations worksheet given at end of session for pt to practice and continue building this skill.   A: Pt presents to group with appropriate affect, engaged and participatory throughout session with redirection. Pt completed A-Z activity with no VC's, improved affect noted after activity. Accepted positive affirmations at end of session.   P: Handouts given to facilitate carryover into community.  Dalphine Handing, MSOT, OTR/L  [place]osalia 06/17/2018, 2:34 PM

## 2018-06-17 NOTE — Progress Notes (Signed)
Adult Psychoeducational Group Note  Date:  06/17/2018 Time:  3:20 AM  Group Topic/Focus:  Wrap-Up Group:   The focus of this group is to help patients review their daily goal of treatment and discuss progress on daily workbooks.  Participation Level:  Active  Participation Quality:  Appropriate  Affect:  Appropriate  Cognitive:  Appropriate  Insight: Appropriate  Engagement in Group:  Engaged  Modes of Intervention:  Discussion  Additional Comments:  Pt rated her over all day a  9 out of 10. Pt stated her goal for today was to attend all group and program. Pt stated she accomplished her goal today. Pt stated her day was a 10 but she crack a tooth at breakfast.  Tammy Lowe 06/17/2018, 3:20 AM

## 2018-06-17 NOTE — Plan of Care (Signed)
  Problem: Activity: Goal: Sleeping patterns will improve Outcome: Progressing Note:  Pt. Stated she slept very well.    Problem: Health Behavior/Discharge Planning: Goal: Identification of resources available to assist in meeting health care needs will improve Outcome: Progressing Note:  In treatment team, pt. Stated that she wanted to get back on medication and requested a referral to a psychologist.

## 2018-06-17 NOTE — Progress Notes (Signed)
Nursing Progress Note [id number]  Data: Patient presents with animated affect and is seen interacting with her peers in the dayroom. Patient complaint with scheduled medications. Patient denies pain/physical complaints. Patient completed self-inventory sheet and rates depression, hopelessness, and anxiety 1,1,3 respectively. Patient rates their sleep and appetite as good/good respectively. Patient states goal for today is to "continue to participate and work towards release". Patient is seen attending groups and visible in the milieu. Patient currently denies SI/HI/AVH.   Action: Patient educated about and provided medication per provider's orders. Patient safety maintained with q15 min safety checks and frequent rounding. Low fall risk precautions in place. Emotional support given. 1:1 interaction and active listening provided. Patient encouraged to attend meals and groups. Patient encouraged to work on treatment plan and goals. Labs, vital signs and patient behavior monitored throughout shift.   Response: Patient remains safe on the unit at this time. Patient is interacting with peers appropriately on the unit. Will continue to support and monitor.

## 2018-06-17 NOTE — Progress Notes (Signed)
Interaction with pt was minimal tonight.  She was in her room most of the time, but did come out a short time to the dayroom.  She reports her day was good.  She denies SI/HI/AVH at this time.  She is working on a discharge plan.  She voiced no concerns or needs to Clinical research associate.  Support and encouragement offered.  Discharge plans are in process.  Safety maintained with q15 minute checks.

## 2018-06-18 MED ORDER — PANTOPRAZOLE SODIUM 40 MG PO TBEC: 40.0000 mg | DELAYED_RELEASE_TABLET | Freq: Every day | ORAL | 0 refills | Status: DC

## 2018-06-18 MED ORDER — TRAZODONE HCL 50 MG PO TABS: 50.0000 mg | ORAL_TABLET | Freq: Every evening | ORAL | 0 refills | Status: DC | PRN

## 2018-06-18 MED ORDER — FLUOXETINE HCL 20 MG PO CAPS: 20.0000 mg | ORAL_CAPSULE | Freq: Every day | ORAL | 0 refills | Status: DC

## 2018-06-18 NOTE — Discharge Summary (Signed)
Physician Discharge Summary Note  Patient:  Tammy Lowe is an 22 y.o., female MRN:  [id number] DOB:  Jul 17, 1996 Patient phone:  [phone] (home)  Patient address:   [address] [place] [address],  Total Time spent with patient: 20 minutes  Date of Admission:  06/15/2018 Date of Discharge: 06/18/18  Reason for Admission:  Worsening depression with intentional overdose of Tyleuol  Principal Problem: MDD (major depressive disorder), recurrent episode, severe [facility]) Discharge Diagnoses: Patient Active Problem List   Diagnosis Date Noted  . MDD (major depressive disorder), recurrent episode, severe (HCC) [F33.2] 06/15/2018  . MDD (major depressive disorder), recurrent severe, without psychosis (HCC) [F33.2] 06/12/2018  . Acetaminophen toxicity [T39.1X1A]   . Overdose [T50.901A] 06/11/2018  . Tylenol overdose [T39.1X1A] 06/11/2018  . Suicide attempt (HCC) [T14.91XA] 06/11/2018  . Transaminitis [R74.0] 06/11/2018  . Hypokalemia [E87.6] 06/11/2018  . Depression [F32.9] 06/11/2018  . Dehydration [E86.0] 06/11/2018  . Hematemesis/vomiting blood [K92.0] 06/11/2018  . Alcohol use [Z78.9] 06/11/2018  . Suicidal ideation [R45.851]     Past Psychiatric History: She was previously treated for depression approximately 1-1/2 years ago.  Was on Celexa for about 3 to 4 months.  It was not effective.  She also has a history of trichotillomania as an adolescent.  She still pulls her hair occasionally.  Past Medical History:  Past Medical History:  Diagnosis Date  . Depression 06/11/2018    Past Surgical History:  Procedure Laterality Date  . APPENDECTOMY     Family History: History reviewed. No pertinent family history. Family Psychiatric  History: Patient stated that her mother, brother and sister have depression and anxiety Social History:  Social History   Substance and Sexual Activity  Alcohol Use Yes   Comment: daily     Social History   Substance and  Sexual Activity  Drug Use Yes  . Types: Marijuana    Social History   Socioeconomic History  . Marital status: Single    Spouse name: Not on file  . Number of children: Not on file  . Years of education: Not on file  . Highest education level: Not on file  Occupational History  . Not on file  Social Needs  . Financial resource strain: Not on file  . Food insecurity:    Worry: Not on file    Inability: Not on file  . Transportation needs:    Medical: Not on file    Non-medical: Not on file  Tobacco Use  . Smoking status: Current Some Day Smoker    Packs/day: 0.25    Years: 0.00    Pack years: 0.00    Types: Cigarettes  . Smokeless tobacco: Never Used  Substance and Sexual Activity  . Alcohol use: Yes    Comment: daily  . Drug use: Yes    Types: Marijuana  . Sexual activity: Yes  Lifestyle  . Physical activity:    Days per week: Not on file    Minutes per session: Not on file  . Stress: Not on file  Relationships  . Social connections:    Talks on phone: Not on file    Gets together: Not on file    Attends religious service: Not on file    Active member of club or organization: Not on file    Attends meetings of clubs or organizations: Not on file    Relationship status: Not on file  Other Topics Concern  . Not on file  Social History Narrative  .  Not on file    Hospital Course:   06/16/18 [facility] MD Assessment: 22 year old female with a past psychiatric history significant for major depression, probable posttraumatic stress disorder and cannabis use disorder who presented to the [facility] emergency department on 06/11/2018 after an intentional overdose of Tylenol. The patient stated that the night prior to admission she took half a bottle of 500 mg Tylenol tablets in a suicide attempt. She also admitted that she had taken 6 small airplane bottles of fireball. She stated that she had several bouts of nausea, and had vomited on several times that was of a dark  color. There were concerns that she may have had hematemesis. Her Tylenol level on admission was 41. She was admitted to the medical hospital after the Tylenol overdose. She was given acetylcysteine. Her liver function enzymes increased during the course of the hospitalization. This stabilized, and she was transferred to the psychiatric facility for evaluation and stabilization. The patient admitted that she had been previously treated for depression with Celexa. She took it approximately a year and a half ago for 3 to 4 months. She did not feel it was beneficial. She admitted to a history of trichotillomania. She states she still pulls her hair out occasionally. She stated that she drinks 4-5 shots or more a night. She denied any previous DUIs or substance abuse treatment programs. She has a family history significant for anxiety and depression in her mother, brother and sister. She admitted to smoking marijuana. She also admitted that her father had was deceased, and had been murdered by a Network engineer.   Patient remained on the [facility] unit for 2 days. The patient stabilized on medication and therapy. Patient was discharged on Prozac 20 mg Daily and Trazodone 50 mg QHS PRN. Patient has shown improvement with improved mood, affect, sleep, appetite, and interaction. Patient has attended group and participated. Patient has been seen in the day room interacting with peers and staff appropriately. Patient denies any SI/HI/AVH and contracts for safety. Patient agrees to follow up at [facility]. Patient is provided with prescriptions for their medications upon discharge.  Physical Findings: AIMS: Facial and Oral Movements Muscles of Facial Expression: None, normal Lips and Perioral Area: None, normal Jaw: None, normal Tongue: None, normal,Extremity Movements Upper (arms, wrists, hands, fingers): None, normal Lower (legs, knees, ankles, toes): None, normal, Trunk Movements Neck,  shoulders, hips: None, normal, Overall Severity Severity of abnormal movements (highest score from questions above): None, normal Incapacitation due to abnormal movements: None, normal Patient's awareness of abnormal movements (rate only patient's report): No Awareness, Dental Status Current problems with teeth and/or dentures?: No Does patient usually wear dentures?: No  CIWA:  CIWA-Ar Total: 0 COWS:     Musculoskeletal: Strength & Muscle Tone: within normal limits Gait & Station: normal Patient leans: N/A  Psychiatric Specialty Exam: Physical Exam  Nursing note and vitals reviewed. Constitutional: She is oriented to person, place, and time. She appears well-developed and well-nourished.  Cardiovascular: Normal rate.  Respiratory: Effort normal.  Musculoskeletal: Normal range of motion.  Neurological: She is alert and oriented to person, place, and time.  Skin: Skin is warm.    Review of Systems  Constitutional: Negative.   HENT: Negative.   Eyes: Negative.   Respiratory: Negative.   Cardiovascular: Negative.   Gastrointestinal: Negative.   Genitourinary: Negative.   Musculoskeletal: Negative.   Skin: Negative.   Neurological: Negative.   Endo/Heme/Allergies: Negative.   Psychiatric/Behavioral: Negative.  Blood pressure 114/88, pulse 85, temperature 97.7 F (36.5 C), temperature source Oral, resp. rate 16, height 5\' 7"  (1.702 m), weight 59.4 kg, last menstrual period 06/08/2018.Body mass index is 20.52 kg/m.  General Appearance: Casual  Eye Contact:  Good  Speech:  Clear and Coherent and Normal Rate  Volume:  Normal  Mood:  Euthymic  Affect:  Appropriate  Thought Process:  Goal Directed and Descriptions of Associations: Intact  Orientation:  Full (Time, Place, and Person)  Thought Content:  WDL  Suicidal Thoughts:  No  Homicidal Thoughts:  No  Memory:  Immediate;   Good Recent;   Good Remote;   Good  Judgement:  Fair  Insight:  Fair  Psychomotor Activity:   Normal  Concentration:  Concentration: Good and Attention Span: Good  Recall:  Good  Fund of Knowledge:  Good  Language:  Good  Akathisia:  No  Handed:  Right  AIMS (if indicated):     Assets:  Communication Skills Desire for Improvement Financial Resources/Insurance Physical Health Social Support Transportation  ADL's:  Intact  Cognition:  WNL  Sleep:  Number of Hours: 6.25     Have you used any form of tobacco in the last 30 days? (Cigarettes, Smokeless Tobacco, Cigars, and/or Pipes): Yes  Has this patient used any form of tobacco in the last 30 days? (Cigarettes, Smokeless Tobacco, Cigars, and/or Pipes) Yes, Yes, A prescription for an FDA-approved tobacco cessation medication was offered at discharge and the patient refused  Blood Alcohol level:  Lab Results  Component Value Date   ETH <10 06/11/2018    Metabolic Disorder Labs:  No results found for: HGBA1C, MPG No results found for: PROLACTIN No results found for: CHOL, TRIG, HDL, CHOLHDL, VLDL, LDLCALC  See Psychiatric Specialty Exam and Suicide Risk Assessment completed by Attending Physician prior to discharge.  Discharge destination:  Home  Is patient on multiple antipsychotic therapies at discharge:  No   Has Patient had three or more failed trials of antipsychotic monotherapy by history:  No  Recommended Plan for Multiple Antipsychotic Therapies: NA   Allergies as of 06/18/2018   No Known Allergies     Medication List    STOP taking these medications   thiamine 100 MG tablet     TAKE these medications     Indication  FLUoxetine 20 MG capsule Commonly known as:  PROZAC Take 1 capsule (20 mg total) by mouth daily. For mood control Start taking on:  06/19/2018  Indication:  mood stability   folic acid 1 MG tablet Commonly known as:  FOLVITE Take 1 tablet (1 mg total) by mouth daily.  Indication:  Per PCP   loratadine 10 MG tablet Commonly known as:  CLARITIN Take 10 mg by mouth daily.   Indication:  Chronic Nonallergic Rhinitis   multivitamin with minerals Tabs tablet Take 1 tablet by mouth daily.  Indication:  Per PCP   norgestrel-ethinyl estradiol 0.3-30 MG-MCG tablet Commonly known as:  LO/OVRAL,CRYSELLE Take 1 tablet by mouth daily.  Indication:  Birth Control Treatment   pantoprazole 40 MG tablet Commonly known as:  PROTONIX Take 1 tablet (40 mg total) by mouth daily. For acid reflux What changed:  additional instructions  Indication:  Gastroesophageal Reflux Disease   traZODone 50 MG tablet Commonly known as:  DESYREL Take 1 tablet (50 mg total) by mouth at bedtime as needed for sleep.  Indication:  Trouble Sleeping      Follow-up Information    [facility]   Specialty:  Professional Counselor Why:  Please attend a walk in appt Monday-Friday between 8:30am-2:30pm.  Please request therapy and medication management services.  Please bring a copy of your hospital discharge paperwork. Contact information: [facility] [address] [place] [address] [phone]           Follow-up recommendations:  Continue activity as tolerated. Continue diet as recommended by your PCP. Ensure to keep all appointments with outpatient providers.  Comments:  Patient is instructed prior to discharge to: Take all medications as prescribed by his/her mental healthcare provider. Report any adverse effects and or reactions from the medicines to his/her outpatient provider promptly. Patient has been instructed & cautioned: To not engage in alcohol and or illegal drug use while on prescription medicines. In the event of worsening symptoms, patient is instructed to call the crisis hotline, 911 and or go to the nearest ED for appropriate evaluation and treatment of symptoms. To follow-up with his/her primary care provider for your other medical issues, concerns and or health care needs.     Signed: Gerlene Burdock Gemma Ruan, FNP 06/18/2018, 8:19 AM

## 2018-06-18 NOTE — [facility] Suicide Risk Assessment (Signed)
[facility]   Principal Problem: <principal problem not specified> Discharge Diagnoses:  Patient Active Problem List   Diagnosis Date Noted  . MDD (major depressive disorder), recurrent episode, severe (HCC) [F33.2] 06/15/2018  . MDD (major depressive disorder), recurrent severe, without psychosis (HCC) [F33.2] 06/12/2018  . Acetaminophen toxicity [T39.1X1A]   . Overdose [T50.901A] 06/11/2018  . Tylenol overdose [T39.1X1A] 06/11/2018  . Suicide attempt (HCC) [T14.91XA] 06/11/2018  . Transaminitis [R74.0] 06/11/2018  . Hypokalemia [E87.6] 06/11/2018  . Depression [F32.9] 06/11/2018  . Dehydration [E86.0] 06/11/2018  . Hematemesis/vomiting blood [K92.0] 06/11/2018  . Alcohol use [Z78.9] 06/11/2018  . Suicidal ideation [R45.851]     Total Time spent with patient: 15 minutes  Musculoskeletal: Strength & Muscle Tone: within normal limits Gait & Station: normal Patient leans: N/A  Psychiatric Specialty Exam: Review of Systems  All other systems reviewed and are negative.   Blood pressure 114/88, pulse 85, temperature 97.7 F (36.5 C), temperature source Oral, resp. rate 16, height 5\' 7"  (1.702 m), weight 59.4 kg, last menstrual period 06/08/2018.Body mass index is 20.52 kg/m.  General Appearance: Casual  Eye Contact::  Good  Speech:  Normal Rate409  Volume:  Normal  Mood:  Euthymic  Affect:  Congruent  Thought Process:  Coherent and Descriptions of Associations: Intact  Orientation:  Full (Time, Place, and Person)  Thought Content:  Logical  Suicidal Thoughts:  No  Homicidal Thoughts:  No  Memory:  Immediate;   Fair Recent;   Fair Remote;   Fair  Judgement:  Good  Insight:  Fair  Psychomotor Activity:  Normal  Concentration:  Good  Recall:  Good  Fund of Knowledge:Good  Language: Good  Akathisia:  Negative  Handed:  Right  AIMS (if indicated):     Assets:  Communication Skills Desire for Improvement Financial  Resources/Insurance Housing Intimacy Leisure Time Physical Health Resilience Social Support Talents/Skills  Sleep:  Number of Hours: 6.25  Cognition: WNL  ADL's:  Intact   Mental Status Per Nursing Assessment::   On Admission:  Self-harm thoughts  Demographic Factors:  Adolescent or young adult and Low socioeconomic status  Loss Factors: NA  Historical Factors: Impulsivity  Risk Reduction Factors:   Employed, Living with another person, especially a relative and Positive social support  Continued Clinical Symptoms:  Depression:   Impulsivity  Cognitive Features That Contribute To Risk:  None    Suicide Risk:  Minimal: No identifiable suicidal ideation.  Patients presenting with no risk factors but with morbid ruminations; may be classified as minimal risk based on the severity of the depressive symptoms    Plan Of Care/Follow-up recommendations:  Activity:  ad lib  Antonieta PertGreg Lawson Clary, MD 06/18/2018, 7:34 AM

## 2018-06-18 NOTE — Progress Notes (Signed)
Patient verbalized in group that she had a good day and that she had a positive family visit as well. Her goal for tomorrow is to participate more than she did today.

## 2018-06-18 NOTE — Progress Notes (Signed)
D: Pt A & O X 4. Presents animated but fidgety and superficial on interactions.Denies SI, HI, AVH and pain when assessed. Reports she's eating and sleeping well with normal energy and good concentration level. Rates her depression 1/10, hopelessness 1/10 and anxiety 0/10 on self inventory sheet. Pt D/C home as ordered. Picked up in lobby by her room mate. A: D/C instructions reviewed with pt including prescriptions, medication samples and follow up appointment; compliance encouraged. All belongings from locker #40 given to pt at time of departure. Scheduled and PRN medications given with verbal education and effects monitored. Safety checks maintained without incident till time of d/c.  R: Pt receptive to care. Compliant with medications when offered. Denies adverse drug reactions when assessed. Verbalized understanding related to d/c instructions. Signed belonging sheet in agreement with items received from locker. Ambulatory with a steady gait. Appears to be in no physical distress at time of departure.

## 2018-06-18 NOTE — Progress Notes (Signed)
  [facility] :  Will you be returning to the same living situation after discharge:  Yes,  with roomate At discharge, do you have transportation home?: Yes,  roomate Do you have the ability to pay for your medications: No. [facility]  Release of information consent forms completed and in the chart;  Patient's signature needed at discharge.  Patient to Follow up at: Follow-up Information    [facility] Go in [facility] day(s).   Specialty:  Professional Counselor Why:  Please attend a walk in appt Monday-Friday between 8:30am-2:30pm.  Please request therapy and medication management services.  Please bring a copy of your hospital discharge paperwork. Contact information: [facility] [address] [place] [address] [phone]           Next level of care provider has [facility] to [facility] Link:no  Safety Planning and Suicide Prevention discussed: Yes,  with roomate  Have you used any form of tobacco in the last 30 days? (Cigarettes, Smokeless Tobacco, Cigars, and/or Pipes): Yes  Has patient been referred to the Quitline?: Patient refused referral  Patient has been referred for addiction treatment: Pt. refused referral  Lorri FrederickWierda, Eudell Mcphee Jon, LCSW 06/18/2018, 11:09 AM

## 2020-01-13 ENCOUNTER — Other Ambulatory Visit: Payer: Self-pay

## 2020-01-13 ENCOUNTER — Emergency Department (HOSPITAL_COMMUNITY): Payer: Self-pay

## 2020-01-13 ENCOUNTER — Encounter (HOSPITAL_COMMUNITY): Payer: Self-pay

## 2020-01-13 ENCOUNTER — Emergency Department (HOSPITAL_COMMUNITY)
Admission: EM | Admit: 2020-01-13 | Discharge: 2020-01-13 | Disposition: A | Discharge status: Home / Self Care | Payer: Self-pay | Attending: Emergency Medicine | Admitting: Emergency Medicine

## 2020-01-13 DIAGNOSIS — Y929 Unspecified place or not applicable: Secondary | ICD-10-CM | POA: Insufficient documentation

## 2020-01-13 DIAGNOSIS — M25461 Effusion, right knee: Secondary | ICD-10-CM

## 2020-01-13 DIAGNOSIS — Z793 Long term (current) use of hormonal contraceptives: Secondary | ICD-10-CM | POA: Insufficient documentation

## 2020-01-13 DIAGNOSIS — W010XXA Fall on same level from slipping, tripping and stumbling without subsequent striking against object, initial encounter: Secondary | ICD-10-CM | POA: Insufficient documentation

## 2020-01-13 DIAGNOSIS — F17210 Nicotine dependence, cigarettes, uncomplicated: Secondary | ICD-10-CM | POA: Insufficient documentation

## 2020-01-13 DIAGNOSIS — S8991XA Unspecified injury of right lower leg, initial encounter: Secondary | ICD-10-CM

## 2020-01-13 DIAGNOSIS — Y999 Unspecified external cause status: Secondary | ICD-10-CM | POA: Insufficient documentation

## 2020-01-13 DIAGNOSIS — Z79899 Other long term (current) drug therapy: Secondary | ICD-10-CM | POA: Insufficient documentation

## 2020-01-13 DIAGNOSIS — S80911A Unspecified superficial injury of right knee, initial encounter: Secondary | ICD-10-CM | POA: Insufficient documentation

## 2020-01-13 DIAGNOSIS — M25561 Pain in right knee: Secondary | ICD-10-CM

## 2020-01-13 DIAGNOSIS — Y939 Activity, unspecified: Secondary | ICD-10-CM | POA: Insufficient documentation

## 2020-01-13 MED ORDER — HYDROCODONE-ACETAMINOPHEN 5-325 MG PO TABS: 1.0000 | ORAL_TABLET | ORAL | 0 refills | Status: DC | PRN

## 2020-01-13 NOTE — Discharge Instructions (Signed)
Your history and exam today are consistent with a patellar dislocation or other knee injury causing the pain.  The x-rays did not show evidence of fracture dislocation but I am still concerned about possible soft tissue injury.  Please use the knee immobilizer and crutches and follow-up with orthopedics.  Please use the pain medicine to help with the symptoms.  Please rest and stay hydrated.  If any symptoms change or worsen, please return to the nearest emergency department.

## 2020-01-13 NOTE — ED Triage Notes (Signed)
Pt presents with c/o right knee pain. 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 Pt was ambulatory to triage with assistance. No obvious deformity noted.

## 2020-01-13 NOTE — ED Provider Notes (Signed)
Tammy Lowe   CSN: [id number] Arrival date & time: 01/13/20  1257     History Chief Complaint  Patient presents with  . Knee Pain    Tammy Lowe is a 24 y.o. female.  The history is provided by the patient and medical records.  Knee Pain Location:  Knee Time since incident:  1 day Injury: yes   Mechanism of injury: fall   Fall:    Fall occurred:  Standing   Impact surface:  Hard floor Knee location:  R knee Pain details:    Quality:  Aching   Radiates to:  Does not radiate   Severity:  Moderate   Onset quality:  Sudden   Timing:  Constant   Progression:  Unchanged Chronicity:  New Prior injury to area:  No Relieved by:  Nothing Worsened by:  Bearing weight Ineffective treatments:  None tried Associated symptoms: no back pain, no decreased ROM, no fatigue, no muscle weakness and no neck pain        Past Medical History:  Diagnosis Date  . Depression 06/11/2018    Patient Active Problem List   Diagnosis Date Noted  . MDD (major depressive disorder), recurrent episode, severe (HCC) 06/15/2018  . MDD (major depressive disorder), recurrent severe, without psychosis (HCC) 06/12/2018  . Acetaminophen toxicity   . Overdose 06/11/2018  . Tylenol overdose 06/11/2018  . Suicide attempt (HCC) 06/11/2018  . Transaminitis 06/11/2018  . Hypokalemia 06/11/2018  . Depression 06/11/2018  . Dehydration 06/11/2018  . Hematemesis/vomiting blood 06/11/2018  . Alcohol use 06/11/2018  . Suicidal ideation     Past Surgical History:  Procedure Laterality Date  . APPENDECTOMY       OB History   No obstetric history on file.     History reviewed. No pertinent family history.  Social History   Tobacco Use  . Smoking status: Current Some Day Smoker    Packs/day: 0.25    Years: 0.00    Pack years: 0.00    Types: Cigarettes  . Smokeless tobacco: Never Used  Substance Use Topics  . Alcohol use: Yes    Comment:  daily  . Drug use: Yes    Types: Marijuana    Home Medications Prior to Admission medications   Medication Sig Start Date End Date Taking? Authorizing Provider  FLUoxetine (PROZAC) 20 MG capsule Take 1 capsule (20 mg total) by mouth daily. For mood control 06/19/18   Money, Gerlene Burdock, FNP  folic acid (FOLVITE) 1 MG tablet Take 1 tablet (1 mg total) by mouth daily. 06/16/18   Rodolph Bong, MD  loratadine (CLARITIN) 10 MG tablet Take 10 mg by mouth daily.    [provider]  Multiple Vitamin (MULTIVITAMIN WITH MINERALS) TABS tablet Take 1 tablet by mouth daily. 06/16/18   Rodolph Bong, MD  norgestrel-ethinyl estradiol (LO/OVRAL,CRYSELLE) 0.3-30 MG-MCG tablet Take 1 tablet by mouth daily.    [provider]  pantoprazole (PROTONIX) 40 MG tablet Take 1 tablet (40 mg total) by mouth daily. For acid reflux 06/18/18   Money, Gerlene Burdock, FNP  traZODone (DESYREL) 50 MG tablet Take 1 tablet (50 mg total) by mouth at bedtime as needed for sleep. 06/18/18   Money, Gerlene Burdock, FNP    Allergies    Patient has no known allergies.  Review of Systems   Review of Systems  Constitutional: Negative for chills, diaphoresis and fatigue.  HENT: Negative for congestion.   Eyes: Negative for visual  disturbance.  Respiratory: Negative for cough, chest tightness and shortness of breath.   Gastrointestinal: Negative for abdominal pain.  Genitourinary: Negative for dysuria.  Musculoskeletal: Negative for back pain, neck pain and neck stiffness.  Skin: Negative for rash and wound.  Neurological: Negative for light-headedness and headaches.  Psychiatric/Behavioral: Negative for agitation.  All other systems reviewed and are negative.   Physical Exam Updated Vital Signs BP 137/84 (BP Location: Right Arm)   Pulse (!) 105   Temp 98.1 F (36.7 C) (Oral)   Resp 18   LMP 01/12/2020 (Approximate)   SpO2 94%   Physical Exam Vitals and nursing Lowe reviewed.  Constitutional:      General:  She is not in acute distress.    Appearance: She is well-developed. She is not diaphoretic.  HENT:     Head: Normocephalic and atraumatic.     Right Ear: External ear normal.     Left Ear: External ear normal.     Nose: Nose normal.     Mouth/Throat:     Pharynx: No oropharyngeal exudate.  Eyes:     Conjunctiva/sclera: Conjunctivae normal.     Pupils: Pupils are equal, round, and reactive to light.  Cardiovascular:     Rate and Rhythm: Regular rhythm.     Pulses: Normal pulses.     Heart sounds: No murmur.  Pulmonary:     Effort: No respiratory distress.     Breath sounds: No stridor.  Chest:     Chest wall: No tenderness.  Abdominal:     General: Abdomen is flat. There is no distension.     Tenderness: There is no abdominal tenderness. There is no right CVA tenderness, left CVA tenderness or rebound.  Musculoskeletal:        General: Swelling, tenderness and signs of injury present.     Cervical back: Normal range of motion and neck supple. No tenderness.     Right lower leg: No edema.     Left lower leg: No edema.  Skin:    General: Skin is warm.     Capillary Refill: Capillary refill takes less than 2 seconds.     Findings: No erythema or rash.  Neurological:     General: No focal deficit present.     Mental Status: She is alert and oriented to person, place, and time.     Sensory: No sensory deficit.     Motor: No weakness or abnormal muscle tone.     Deep Tendon Reflexes: Reflexes are normal and symmetric.  Psychiatric:        Mood and Affect: Mood normal.     ED Results / Procedures / Treatments   Labs (all labs ordered are listed, but only abnormal results are displayed) Labs Reviewed - No data to display  EKG None  Radiology DG Knee Complete 4 Views Right  Result Date: 01/13/2020 CLINICAL DATA:  Right knee pain. Pain onset after doing a split and feeling knee shift. EXAM: RIGHT KNEE - COMPLETE 4+ VIEW COMPARISON:  None. FINDINGS: No evidence of fracture  or dislocation. No evidence of arthropathy or other focal bone abnormality. Small knee joint effusion. Soft tissues are unremarkable. IMPRESSION: Small knee joint effusion.  No fracture or dislocation. Electronically Signed   By: Narda Rutherford M.D.   On: 01/13/2020 14:13    Procedures Procedures (including critical care time)  Medications Ordered in ED Medications - No data to display  ED Course  I have reviewed the triage vital signs  and the nursing notes.  Pertinent labs & imaging results that were available during my care of the patient were reviewed by me and considered in my medical decision making (see chart for details).    MDM Rules/Calculators/A&P                      Tammy Lowe is a 24 y.o. female with no significant past medical history who presents with right knee injury.  Patient reports that she was dancing at her job last night around midnight when she tried to do the splits and hit her right knee on the ground causing it to start hurting.  She reports that she thought her kneecap went to the side and then quickly went back to normal.  She reports has been able to walk on it due to the pain.  She reports no other injuries or other complaints.  She denies any pain in the other leg.  She reports it is slightly swollen and she has never had any history of knee surgery or injuries.  She describes the pain is severe.  On exam, patient is knee is tender to palpation and there is a small effusion seen.  No patellar tenderness.  Patient had pain when I moved her knee.  There was no limiting of range of motion however.  Normal sensation and strength in the feet and good pulses are present.  No hip tenderness.  Exam otherwise unremarkable with clear lungs and chest abdomen nontender.  Patient had no other abnormalities on exam.  Patient had x-ray showing a small effusion but no evidence of fracture dislocation.  Suspect patient either had a transient patellar dislocation that  reduced or other soft tissue or ligamentous injury.   Patient was given a knee immobilizer and crutches and pain medicine prescription.  She will follow up with orthopedics for outpatient management.  She agreed with plan of care and follow-up and had no other questions or concerns.  Patient discharged in good condition for outpatient management of knee injury.   Final Clinical Impression(s) / ED Diagnoses Final diagnoses:  Injury of right knee, initial encounter  Acute pain of right knee  Effusion of right knee    Rx / DC Orders ED Discharge Orders         Ordered    HYDROcodone-acetaminophen (NORCO/VICODIN) 5-325 MG tablet  Every 4 hours PRN     01/13/20 1512         Clinical Impression: 1. Injury of right knee, initial encounter   2. Acute pain of right knee   3. Effusion of right knee     Disposition: Discharge  Condition: Good  I have discussed the results, Dx and Tx plan with the pt(& family if present). He/she/they expressed understanding and agree(s) with the plan. Discharge instructions discussed at great length. Strict return precautions discussed and pt &/or family have verbalized understanding of the instructions. No further questions at time of discharge.    Discharge Medication List as of 01/13/2020  3:13 PM    START taking these medications   Details  HYDROcodone-acetaminophen (NORCO/VICODIN) 5-325 MG tablet Take 1 tablet by mouth every 4 (four) hours as needed., Starting Thu 01/13/2020, Normal        Follow Up: [facility], [place] [address] [phone]   with orthopedics     Monchel Pollitt, Gwenyth Allegra, MD 01/13/20 2155

## 2020-06-23 ENCOUNTER — Emergency Department (HOSPITAL_COMMUNITY): Payer: Self-pay

## 2020-06-23 ENCOUNTER — Encounter (HOSPITAL_COMMUNITY): Payer: Self-pay

## 2020-06-23 ENCOUNTER — Emergency Department (HOSPITAL_COMMUNITY)
Admission: EM | Admit: 2020-06-23 | Discharge: 2020-06-23 | Disposition: A | Discharge status: Home / Self Care | Payer: Self-pay | Attending: Emergency Medicine | Admitting: Emergency Medicine

## 2020-06-23 ENCOUNTER — Other Ambulatory Visit: Payer: Self-pay

## 2020-06-23 DIAGNOSIS — S72422A Displaced fracture of lateral condyle of left femur, initial encounter for closed fracture: Secondary | ICD-10-CM | POA: Insufficient documentation

## 2020-06-23 DIAGNOSIS — F17210 Nicotine dependence, cigarettes, uncomplicated: Secondary | ICD-10-CM | POA: Insufficient documentation

## 2020-06-23 DIAGNOSIS — M25562 Pain in left knee: Secondary | ICD-10-CM

## 2020-06-23 DIAGNOSIS — Z79899 Other long term (current) drug therapy: Secondary | ICD-10-CM | POA: Insufficient documentation

## 2020-06-23 DIAGNOSIS — S72492A Other fracture of lower end of left femur, initial encounter for closed fracture: Secondary | ICD-10-CM

## 2020-06-23 DIAGNOSIS — X501XXA Overexertion from prolonged static or awkward postures, initial encounter: Secondary | ICD-10-CM | POA: Insufficient documentation

## 2020-06-23 LAB — PREGNANCY, URINE: Preg Test, Ur: NEGATIVE

## 2020-06-23 MED ORDER — OXYCODONE-ACETAMINOPHEN 5-325 MG PO TABS: 1.0000 | ORAL_TABLET | Freq: Four times a day (QID) | ORAL | 0 refills | Status: DC | PRN

## 2020-06-23 MED ORDER — OXYCODONE-ACETAMINOPHEN 5-325 MG PO TABS
1.0000 | ORAL_TABLET | Freq: Once | ORAL | Status: AC
  Administered 2020-06-23: 1 via ORAL
  Filled 2020-06-23: qty 1

## 2020-06-23 NOTE — ED Notes (Signed)
Ortho tech called for knee immobilizer. °

## 2020-06-23 NOTE — Discharge Instructions (Signed)
Please call Dr. Austin Miles office to schedule an appointment for next week.  Wear the knee immobilizer at all times. Use crutches to prevent baring weight onto your leg. You should not bare any weight until you can see orthopedist.   I have prescribed a short course of pain medication for you to take as needed. I would recommend Ibuprofen 600 mg every 6-8 hours as needed for pain and if you have breakthrough pain you can take the narcotic pain medication.   While at home please rest, ice, and elevate your knee to help with swelling/inflammation.   A work note has been provided to you.   Return to the ED for any worsening symptoms.

## 2020-06-23 NOTE — ED Triage Notes (Signed)
Patient reports going down stairs yesterday and twisted her left knee. Patient using crutches to get here.   Patient reports previously hurting her right knee.    A/ox4

## 2020-06-23 NOTE — ED Provider Notes (Signed)
[facility] Provider Note   CSN: [id number] Arrival date & time: 06/23/20  1046     History Chief Complaint  Patient presents with  . Knee Pain    Left    Tammy Lowe is a 24 y.o. female who presents to the ED today with complaint of sudden onset, constant, severe, L knee pain that began yesterday. Pt reports she was at work walking down stairs when her left knee gave out. She reports twisting her knee and then feeling immediate pain and swelling since then. She is unsure if she twisted inward or outward. She has been using crutches to ambulate since - has had the crutches for a previous injury to her R knee howver states this is much worse. She has not taken anything for pain at home. Has been applying ice. No other complaints at this time.    The history is provided by the patient and medical records.       Past Medical History:  Diagnosis Date  . Depression 06/11/2018    Patient Active Problem List   Diagnosis Date Noted  . MDD (major depressive disorder), recurrent episode, severe (HCC) 06/15/2018  . MDD (major depressive disorder), recurrent severe, without psychosis (HCC) 06/12/2018  . Acetaminophen toxicity   . Overdose 06/11/2018  . Tylenol overdose 06/11/2018  . Suicide attempt (HCC) 06/11/2018  . Transaminitis 06/11/2018  . Hypokalemia 06/11/2018  . Depression 06/11/2018  . Dehydration 06/11/2018  . Hematemesis/vomiting blood 06/11/2018  . Alcohol use 06/11/2018  . Suicidal ideation     Past Surgical History:  Procedure Laterality Date  . APPENDECTOMY       OB History   No obstetric history on file.     History reviewed. No pertinent family history.  Social History   Tobacco Use  . Smoking status: Current Some Day Smoker    Packs/day: 0.25    Years: 0.00    Pack years: 0.00    Types: Cigarettes  . Smokeless tobacco: Never Used  Substance Use Topics  . Alcohol use: Yes    Comment: daily  . Drug use:  Yes    Types: Marijuana    Home Medications Prior to Admission medications   Medication Sig Start Date End Date Taking? Authorizing Provider  FLUoxetine (PROZAC) 20 MG capsule Take 1 capsule (20 mg total) by mouth daily. For mood control 06/19/18   Money, Gerlene Burdock, FNP  folic acid (FOLVITE) 1 MG tablet Take 1 tablet (1 mg total) by mouth daily. 06/16/18   Rodolph Bong, MD  HYDROcodone-acetaminophen (NORCO/VICODIN) 5-325 MG tablet Take 1 tablet by mouth every 4 (four) hours as needed. 01/13/20   Tegeler, Canary Brim, MD  loratadine (CLARITIN) 10 MG tablet Take 10 mg by mouth daily.    [provider]  Multiple Vitamin (MULTIVITAMIN WITH MINERALS) TABS tablet Take 1 tablet by mouth daily. 06/16/18   Rodolph Bong, MD  norgestrel-ethinyl estradiol (LO/OVRAL,CRYSELLE) 0.3-30 MG-MCG tablet Take 1 tablet by mouth daily.    [provider]  oxyCODONE-acetaminophen (PERCOCET/ROXICET) 5-325 MG tablet Take 1 tablet by mouth every 6 (six) hours as needed for severe pain. 06/23/20   Hyman Hopes, Broady Lafoy, PA-C  pantoprazole (PROTONIX) 40 MG tablet Take 1 tablet (40 mg total) by mouth daily. For acid reflux 06/18/18   Money, Gerlene Burdock, FNP  traZODone (DESYREL) 50 MG tablet Take 1 tablet (50 mg total) by mouth at bedtime as needed for sleep. 06/18/18   Money, Gerlene Burdock, FNP  Allergies    Patient has no known allergies.  Review of Systems   Review of Systems  Constitutional: Negative for chills and fever.  Musculoskeletal: Positive for arthralgias and joint swelling.  Skin: Negative for wound.  Neurological: Negative for weakness and numbness.    Physical Exam Updated Vital Signs BP 121/82 (BP Location: Right Arm)   Pulse 80   Temp 99.3 F (37.4 C) (Oral)   Resp 15   LMP 06/18/2020   SpO2 100%   Physical Exam Vitals and nursing note reviewed.  Constitutional:      Appearance: She is not ill-appearing.  HENT:     Head: Normocephalic and atraumatic.  Eyes:      Conjunctiva/sclera: Conjunctivae normal.  Cardiovascular:     Rate and Rhythm: Normal rate and regular rhythm.  Pulmonary:     Effort: Pulmonary effort is normal.     Breath sounds: Normal breath sounds. No wheezing, rhonchi or rales.  Musculoskeletal:     Comments: Moderate swelling diffusely to L knee compared to R. + Diffuse anterior knee TTP however appears worse to the medial and lateral aspects of the knee. No popliteal TTP. ROM limited s/2 pain. Negative anterior and posterior drawer test. Unable to fully assess for varus/valgus instability due to amount of pain. Sensation intact throughout. 2+ PT pulse.   Skin:    General: Skin is warm and dry.     Coloration: Skin is not jaundiced.  Neurological:     Mental Status: She is alert.     ED Results / Procedures / Treatments   Labs (all labs ordered are listed, but only abnormal results are displayed) Labs Reviewed  PREGNANCY, URINE    EKG None  Radiology CT Knee Left Wo Contrast  Result Date: 06/23/2020 CLINICAL DATA:  Patient reports going down stairs yesterday and twisted her left knee. Patient using crutches to get here. Pt states she has had multiple problems with her knees. EXAM: CT OF THE LEFT KNEE WITHOUT CONTRAST TECHNIQUE: Multidetector CT imaging of the LEFT knee was performed according to the standard protocol. Multiplanar CT image reconstructions were also generated. COMPARISON:  None. FINDINGS: Bones/Joint/Cartilage 14 x 15 mm osteochondral fracture involving the peripheral weight-bearing surface of the lateral femoral condyle with the fragment displaced just inferior to the patellofemoral joint. Large hemarthrosis. No other acute fracture or dislocation. No aggressive osseous lesion. Ligaments Ligaments are suboptimally evaluated by CT. Muscles and Tendons Muscles are normal.  No muscle atrophy. Soft tissue No fluid collection or hematoma.  No soft tissue mass. IMPRESSION: A 14 x 15 mm osteochondral fracture involving  the peripheral weight-bearing surface of the lateral femoral condyle with the fragment displaced just inferior to the patellofemoral joint. Large hemarthrosis. Electronically Signed   By: Elige Ko   On: 06/23/2020 14:49   DG Knee Complete 4 Views Left  Result Date: 06/23/2020 CLINICAL DATA:  Acute left knee pain after injury yesterday. EXAM: LEFT KNEE - COMPLETE 4+ VIEW COMPARISON:  None. FINDINGS: Moderate size suprapatellar joint effusion is noted. There is a small bone fragment seen anterior to the distal femur and inferior to the patella concerning for avulsion fracture, which appears to be arising from the lateral femoral condyle. IMPRESSION: Probable avulsion fracture seen anterior to the distal femur and inferior to the patella, which appears to be arising from the lateral femoral condyle. Moderate size suprapatellar joint effusion is noted. CT scan may be performed for further evaluation. Electronically Signed   By: Lupita Raider  M.D.   On: 06/23/2020 11:45    Procedures Procedures (including critical care time)  Medications Ordered in ED Medications  oxyCODONE-acetaminophen (PERCOCET/ROXICET) 5-325 MG per tablet 1 tablet (1 tablet Oral Given 06/23/20 1215)    ED Course  I have reviewed the triage vital signs and the nursing notes.  Pertinent labs & imaging results that were available during my care of the patient were reviewed by me and considered in my medical decision making (see chart for details).    MDM Rules/Calculators/A&P                          24 year old female who presents to the ED today complaining of sudden onset left knee pain after twisting her knee walking downstairs last night.  Has been unable to bear weight since then has been using crutches from a previous injury.  Has not taken anything for pain however has applied ice.  Arrival to the ED patient is afebrile, nontachycardic and nontachypneic.  An x-ray was ordered prior to patient being seen.  Exam  patient has diffuse swelling and tenderness to palpation however her pain appears to be more along the medial and lateral aspects of the knee.  Range of motion limited secondary to pain.  Negative for anterior posterior drawer test.  Unable to fully assess varus and valgus laxity due to amount of pain/swelling.  Will provide pain medication and reassess.  Will await x-ray results.  X-ray with concern for probable avulsion fracture seen anterior to the distal femur and inferior to the patella.  Radiologist recommends a CT scan for further evaluation.  We will plan to discuss with orthopedics to see if this would be helpful or what they would recommend otherwise.   Discussed case with Dr. Everardo PacificVarkey - currently in the OR however agrees with CT scan and will call back for further reccs.   Dr. Everardo PacificVarkey called back; ultimately pt will likely need an MRI however okay with CT scan in the ED. Recommends knee immobilizer and crutches with [facility] and to follow up in the office.   CT scan IMPRESSION:  A 14 x 15 mm osteochondral fracture involving the peripheral  weight-bearing surface of the lateral femoral condyle with the  fragment displaced just inferior to the patellofemoral joint. Large  hemarthrosis.     Knee immobilizer provided. Pt already has crutches. Will have her follow up with Dr. Everardo PacificVarkey in the office next week. Work note provided. Pain meds as needed. RICE therapy discussed. Pt is in agreement with plan and stable for discharge home.   This note was prepared using Dragon voice recognition software and may include unintentional dictation errors due to the inherent limitations of voice recognition software.  Final Clinical Impression(s) / ED Diagnoses Final diagnoses:  Acute pain of left knee  Closed osteochondral fracture of distal end of left femur, initial encounter (HCC)    Rx / DC Orders ED Discharge Orders         Ordered    oxyCODONE-acetaminophen (PERCOCET/ROXICET) 5-325 MG tablet   Every 6 hours PRN        06/23/20 1525           Discharge Instructions     Please call Dr. Austin MilesVarkey's office to schedule an appointment for next week.  Wear the knee immobilizer at all times. Use crutches to prevent baring weight onto your leg. You should not bare any weight until you can see orthopedist.   I have  prescribed a short course of pain medication for you to take as needed. I would recommend Ibuprofen 600 mg every 6-8 hours as needed for pain and if you have breakthrough pain you can take the narcotic pain medication.   While at home please rest, ice, and elevate your knee to help with swelling/inflammation.   A work note has been provided to you.   Return to the ED for any worsening symptoms.        Tanda Rockers, PA-C 06/23/20 1529    Melene Plan, DO 06/25/20 2304

## 2020-06-23 NOTE — Progress Notes (Signed)
Orthopedic Tech Progress Note Patient Details:  Tammy Lowe December 25, 1995 [id number]  Ortho Devices Type of Ortho Device: Knee Immobilizer Ortho Device/Splint Location: left Ortho Device/Splint Interventions: Application   Post Interventions Patient Tolerated: Well Instructions Provided: Care of device   Saul Fordyce 06/23/2020, 3:29 PM

## 2020-06-30 ENCOUNTER — Other Ambulatory Visit: Payer: Self-pay

## 2020-06-30 ENCOUNTER — Encounter (HOSPITAL_BASED_OUTPATIENT_CLINIC_OR_DEPARTMENT_OTHER): Payer: Self-pay | Admitting: Orthopaedic Surgery

## 2020-07-03 ENCOUNTER — Other Ambulatory Visit (HOSPITAL_COMMUNITY)
Admission: RE | Admit: 2020-07-03 | Discharge: 2020-07-03 | Disposition: A | Discharge status: Home / Self Care | Payer: HRSA Program | Source: Ambulatory Visit | Attending: Orthopaedic Surgery | Admitting: Orthopaedic Surgery

## 2020-07-03 DIAGNOSIS — Z20822 Contact with and (suspected) exposure to covid-19: Secondary | ICD-10-CM | POA: Insufficient documentation

## 2020-07-03 DIAGNOSIS — Z01812 Encounter for preprocedural laboratory examination: Secondary | ICD-10-CM | POA: Insufficient documentation

## 2020-07-03 LAB — SARS CORONAVIRUS 2 (TAT 6-24 HRS): SARS Coronavirus 2: NEGATIVE

## 2020-07-05 NOTE — H&P (Signed)
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 female who is scheduled for LEFT ARTHROSCOPY KNEE REMOVAL OF LOOSE FOREIGN BODY AND LIGAMENT RECONSTRUCTION KNEE EXTRA ARTICULAR.   The patient is a healthy 24 year old who has a history of contralateral patellofemoral joint instability who felt a pop in her left knee when she twisted.  She was seen at [facility] on Friday. She had immediate pain and swelling in the knee, and she has been unable to really walk on it since.    His symptoms are rated as moderate to severe, and have been worsening.  This is significantly impairing activities of daily living.    Please see clinic note for further details on this patient's care.    She has elected for surgical management.   Past Medical History:  Diagnosis Date  . Depression 06/11/2018  . History of intentional self-harm    Past Surgical History:  Procedure Laterality Date  . APPENDECTOMY     Social History   Socioeconomic History  . Marital status: Single    Spouse name: Not on file  . Number of children: Not on file  . Years of education: Not on file  . Highest education level: Not on file  Occupational History  . Not on file  Tobacco Use  . Smoking status: Former Smoker    Packs/day: 0.00    Years: 0.00    Pack years: 0.00  . Smokeless tobacco: Never Used  Substance and Sexual Activity  . Alcohol use: Not Currently    Comment: occas  . Drug use: Yes    Types: Marijuana  . Sexual activity: Yes  Other Topics Concern  . Not on file  Social History Narrative  . Not on file   Social Determinants of Health   Financial Resource Strain:   . Difficulty of Paying Living Expenses: Not on file  Food Insecurity:   . Worried About Programme researcher, broadcasting/film/video in the Last Year: Not on file  . Ran Out of Food in the Last Year: Not on file  Transportation Needs:   . Lack of Transportation (Medical): Not on file  .  Lack of Transportation (Non-Medical): Not on file  Physical Activity:   . Days of Exercise per Week: Not on file  . Minutes of Exercise per Session: Not on file  Stress:   . Feeling of Stress : Not on file  Social Connections:   . Frequency of Communication with Friends and Family: Not on file  . Frequency of Social Gatherings with Friends and Family: Not on file  . Attends Religious Services: Not on file  . Active Member of Clubs or Organizations: Not on file  . Attends Banker Meetings: Not on file  . Marital Status: Not on file   History reviewed. No pertinent family history. No Known Allergies Prior to Admission medications   Medication Sig Start Date End Date Taking? Authorizing Provider  gabapentin (NEURONTIN) 100 MG capsule Take 100 mg by mouth 3 (three) times daily.   Yes [provider]    ROS: All other systems have been reviewed and were otherwise negative with the exception of those mentioned in the HPI and as above.  Physical Exam: General: Alert, no acute distress Cardiovascular: No pedal edema Respiratory: No cyanosis, no use of accessory musculature GI: No organomegaly, abdomen is soft and non-tender Skin: No lesions in the area of chief complaint  Neurologic: Sensation intact distally Psychiatric: Patient is competent for consent with normal mood and affect Lymphatic: No axillary or cervical lymphadenopathy  MUSCULOSKELETAL:  Examination of the left knee demonstrates obvious large effusion.  Distal motor and sensory function is intact.  She is not able to demonstrate range of motion.  She has significant patellar apprehension.   Imaging: CT of left knee demonstrating a 14 x 15 mm osteochondral fracture involving the peripheral weight-bearing surface of the lateral femoral condyle with the fragment displaced just inferior to the patellofemoral joint. Large hemarthrosis.  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  She will obviously need surgery with a loose osteochondral fragment in her knee.  We hope that it is able and amenable to surgery and repair.  If not, she may be a candidate for microfracture.  She doesn't have insurance and this limits her ability to have surgery and rehab.  We worry about stiffness.  We talked about an MPFL reconstruction with an ORIF of an osteochondral fragment.  She understands the risks and benefits.    The risks benefits and alternatives were discussed with the patient including but not limited to the risks of nonoperative treatment, versus surgical intervention including infection, bleeding, nerve injury,  blood clots, cardiopulmonary complications, morbidity, mortality, among others, and they were willing to proceed.   The patient acknowledged the explanation, agreed to proceed with the plan and consent was signed.   Operative Plan: Left knee scope with MPFL reconstruction with an ORIF of an osteochondral fragment Discharge Medications: Standard DVT Prophylaxis: Aspirin Physical Therapy: Outpatient PT Special Discharge needs: Knee immobilizer   Vernetta Honey, PA-C  07/05/2020 3:13 PM

## 2020-07-06 ENCOUNTER — Encounter (HOSPITAL_BASED_OUTPATIENT_CLINIC_OR_DEPARTMENT_OTHER): Payer: Self-pay | Admitting: Orthopaedic Surgery

## 2020-07-06 ENCOUNTER — Other Ambulatory Visit: Payer: Self-pay

## 2020-07-06 ENCOUNTER — Ambulatory Visit (HOSPITAL_BASED_OUTPATIENT_CLINIC_OR_DEPARTMENT_OTHER)
Admission: RE | Admit: 2020-07-06 | Discharge: 2020-07-06 | Disposition: A | Discharge status: Home / Self Care | Payer: Self-pay | Attending: Orthopaedic Surgery | Admitting: Orthopaedic Surgery

## 2020-07-06 ENCOUNTER — Encounter (HOSPITAL_BASED_OUTPATIENT_CLINIC_OR_DEPARTMENT_OTHER)
Admission: RE | Disposition: A | Discharge status: Home / Self Care | Payer: Self-pay | Source: Home / Self Care | Attending: Orthopaedic Surgery

## 2020-07-06 ENCOUNTER — Ambulatory Visit (HOSPITAL_COMMUNITY): Payer: Self-pay

## 2020-07-06 ENCOUNTER — Ambulatory Visit (HOSPITAL_BASED_OUTPATIENT_CLINIC_OR_DEPARTMENT_OTHER): Payer: Self-pay | Admitting: Certified Registered"

## 2020-07-06 DIAGNOSIS — X500XXA Overexertion from strenuous movement or load, initial encounter: Secondary | ICD-10-CM | POA: Insufficient documentation

## 2020-07-06 DIAGNOSIS — F329 Major depressive disorder, single episode, unspecified: Secondary | ICD-10-CM | POA: Insufficient documentation

## 2020-07-06 DIAGNOSIS — M25469 Effusion, unspecified knee: Secondary | ICD-10-CM | POA: Insufficient documentation

## 2020-07-06 DIAGNOSIS — M2342 Loose body in knee, left knee: Secondary | ICD-10-CM | POA: Insufficient documentation

## 2020-07-06 DIAGNOSIS — Z79899 Other long term (current) drug therapy: Secondary | ICD-10-CM | POA: Insufficient documentation

## 2020-07-06 DIAGNOSIS — Y939 Activity, unspecified: Secondary | ICD-10-CM | POA: Insufficient documentation

## 2020-07-06 DIAGNOSIS — Z87891 Personal history of nicotine dependence: Secondary | ICD-10-CM | POA: Insufficient documentation

## 2020-07-06 DIAGNOSIS — Z419 Encounter for procedure for purposes other than remedying health state, unspecified: Secondary | ICD-10-CM

## 2020-07-06 DIAGNOSIS — S83005A Unspecified dislocation of left patella, initial encounter: Secondary | ICD-10-CM | POA: Insufficient documentation

## 2020-07-06 HISTORY — PX: KNEE ARTHROSCOPY WITH DRILLING/MICROFRACTURE: SHX6425

## 2020-07-06 HISTORY — PX: KNEE ARTHROSCOPY WITH MEDIAL PATELLAR FEMORAL LIGAMENT RECONSTRUCTION: SHX5652

## 2020-07-06 LAB — POCT PREGNANCY, URINE: Preg Test, Ur: NEGATIVE

## 2020-07-06 SURGERY — REPAIR, TENDON, PATELLAR, ARTHROSCOPIC
Anesthesia: General | Site: Knee | Laterality: Left

## 2020-07-06 MED ORDER — ROPIVACAINE HCL 5 MG/ML IJ SOLN
INTRAMUSCULAR | Status: DC | PRN
  Administered 2020-07-06: 30 mL via PERINEURAL

## 2020-07-06 MED ORDER — HYDROMORPHONE HCL 1 MG/ML IJ SOLN
INTRAMUSCULAR | Status: AC
  Filled 2020-07-06: qty 0.5

## 2020-07-06 MED ORDER — PROPOFOL 10 MG/ML IV BOLUS
INTRAVENOUS | Status: AC
  Filled 2020-07-06: qty 20

## 2020-07-06 MED ORDER — OXYCODONE HCL 5 MG PO TABS: ORAL_TABLET | ORAL | 0 refills | Status: AC

## 2020-07-06 MED ORDER — AMISULPRIDE (ANTIEMETIC) 5 MG/2ML IV SOLN: 10.0000 mg | Freq: Once | INTRAVENOUS | Status: DC | PRN

## 2020-07-06 MED ORDER — ESMOLOL HCL 100 MG/10ML IV SOLN
INTRAVENOUS | Status: DC | PRN
  Administered 2020-07-06: 20 mg via INTRAVENOUS

## 2020-07-06 MED ORDER — FENTANYL CITRATE (PF) 100 MCG/2ML IJ SOLN
INTRAMUSCULAR | Status: AC
  Filled 2020-07-06: qty 2

## 2020-07-06 MED ORDER — MIDAZOLAM HCL 2 MG/2ML IJ SOLN
INTRAMUSCULAR | Status: AC
  Filled 2020-07-06: qty 2

## 2020-07-06 MED ORDER — VANCOMYCIN HCL 1 G IV SOLR
INTRAVENOUS | Status: DC | PRN
  Administered 2020-07-06: 1000 mg

## 2020-07-06 MED ORDER — CEFAZOLIN SODIUM-DEXTROSE 2-4 GM/100ML-% IV SOLN
2.0000 g | INTRAVENOUS | Status: AC
  Administered 2020-07-06: 2 g via INTRAVENOUS

## 2020-07-06 MED ORDER — FENTANYL CITRATE (PF) 100 MCG/2ML IJ SOLN
100.0000 ug | Freq: Once | INTRAMUSCULAR | Status: AC
  Administered 2020-07-06: 100 ug via INTRAVENOUS

## 2020-07-06 MED ORDER — LIDOCAINE 2% (20 MG/ML) 5 ML SYRINGE
INTRAMUSCULAR | Status: DC | PRN
  Administered 2020-07-06: 60 mg via INTRAVENOUS

## 2020-07-06 MED ORDER — DEXAMETHASONE SODIUM PHOSPHATE 10 MG/ML IJ SOLN
INTRAMUSCULAR | Status: DC | PRN
  Administered 2020-07-06: 5 mg via INTRAVENOUS

## 2020-07-06 MED ORDER — MIDAZOLAM HCL 2 MG/2ML IJ SOLN
2.0000 mg | Freq: Once | INTRAMUSCULAR | Status: AC
  Administered 2020-07-06: 2 mg via INTRAVENOUS

## 2020-07-06 MED ORDER — LACTATED RINGERS IV SOLN: INTRAVENOUS | Status: DC

## 2020-07-06 MED ORDER — ACETAMINOPHEN 500 MG PO TABS: 1000.0000 mg | ORAL_TABLET | Freq: Three times a day (TID) | ORAL | 0 refills | Status: AC

## 2020-07-06 MED ORDER — FENTANYL CITRATE (PF) 100 MCG/2ML IJ SOLN
INTRAMUSCULAR | Status: AC
Start: 2020-07-06 — End: ?
  Filled 2020-07-06: qty 2

## 2020-07-06 MED ORDER — HYDROMORPHONE HCL 1 MG/ML IJ SOLN
0.2500 mg | INTRAMUSCULAR | Status: DC | PRN
  Administered 2020-07-06 (×3): 0.5 mg via INTRAVENOUS

## 2020-07-06 MED ORDER — SODIUM CHLORIDE 0.9 % IR SOLN
Status: DC | PRN
  Administered 2020-07-06: 7500 mL

## 2020-07-06 MED ORDER — ASPIRIN 81 MG PO CHEW: 81.0000 mg | CHEWABLE_TABLET | Freq: Two times a day (BID) | ORAL | 0 refills | Status: AC

## 2020-07-06 MED ORDER — ONDANSETRON HCL 4 MG PO TABS: 4.0000 mg | ORAL_TABLET | Freq: Three times a day (TID) | ORAL | 1 refills | Status: AC | PRN

## 2020-07-06 MED ORDER — OXYCODONE HCL 5 MG PO TABS
5.0000 mg | ORAL_TABLET | Freq: Once | ORAL | Status: AC | PRN
  Administered 2020-07-06: 5 mg via ORAL

## 2020-07-06 MED ORDER — CEFAZOLIN SODIUM-DEXTROSE 2-4 GM/100ML-% IV SOLN
INTRAVENOUS | Status: AC
  Filled 2020-07-06: qty 100

## 2020-07-06 MED ORDER — PROMETHAZINE HCL 25 MG/ML IJ SOLN: 6.2500 mg | INTRAMUSCULAR | Status: DC | PRN

## 2020-07-06 MED ORDER — FENTANYL CITRATE (PF) 100 MCG/2ML IJ SOLN
INTRAMUSCULAR | Status: DC | PRN
  Administered 2020-07-06 (×2): 50 ug via INTRAVENOUS
  Administered 2020-07-06 (×2): 25 ug via INTRAVENOUS
  Administered 2020-07-06: 50 ug via INTRAVENOUS

## 2020-07-06 MED ORDER — PROPOFOL 10 MG/ML IV BOLUS
INTRAVENOUS | Status: DC | PRN
  Administered 2020-07-06: 200 mg via INTRAVENOUS

## 2020-07-06 MED ORDER — METHOCARBAMOL 500 MG PO TABS: 500.0000 mg | ORAL_TABLET | Freq: Three times a day (TID) | ORAL | 0 refills | Status: AC | PRN

## 2020-07-06 MED ORDER — MEPERIDINE HCL 25 MG/ML IJ SOLN: 6.2500 mg | INTRAMUSCULAR | Status: DC | PRN

## 2020-07-06 MED ORDER — OXYCODONE HCL 5 MG PO TABS
ORAL_TABLET | ORAL | Status: AC
  Filled 2020-07-06: qty 1

## 2020-07-06 MED ORDER — OXYCODONE HCL 5 MG/5ML PO SOLN: 5.0000 mg | Freq: Once | ORAL | Status: AC | PRN

## 2020-07-06 MED ORDER — MELOXICAM 7.5 MG PO TABS: 7.5000 mg | ORAL_TABLET | Freq: Every day | ORAL | 0 refills | Status: AC

## 2020-07-06 MED ORDER — ONDANSETRON HCL 4 MG/2ML IJ SOLN
INTRAMUSCULAR | Status: DC | PRN
  Administered 2020-07-06: 4 mg via INTRAVENOUS

## 2020-07-06 SURGICAL SUPPLY — 86 items
ANCH SUT 2 SUTTK 12X2.4 STRL (Anchor) ×4 IMPLANT
ANCHOR SUTURETAK 2.4X12 BIOC # (Anchor) ×8 IMPLANT
APL PRP STRL LF DISP 70% ISPRP (MISCELLANEOUS) ×2
BLADE HEX COATED 2.75 (ELECTRODE) ×4 IMPLANT
BLADE SHAVER BONE 5.0MM X 13CM (MISCELLANEOUS)
BLADE SHAVER BONE 5.0X13 (MISCELLANEOUS) IMPLANT
BLADE SURG 10 STRL SS (BLADE) IMPLANT
BLADE SURG 15 STRL LF DISP TIS (BLADE) ×2 IMPLANT
BLADE SURG 15 STRL SS (BLADE) ×4
BNDG COHESIVE 4X5 TAN STRL (GAUZE/BANDAGES/DRESSINGS) IMPLANT
BNDG ELASTIC 6X5.8 VLCR STR LF (GAUZE/BANDAGES/DRESSINGS) ×4 IMPLANT
BURR OVAL 8 FLU 4.0MM X 13CM (MISCELLANEOUS)
BURR OVAL 8 FLU 4.0X13 (MISCELLANEOUS) IMPLANT
CHLORAPREP W/TINT 26 (MISCELLANEOUS) ×4 IMPLANT
CLOSURE STERI-STRIP 1/2X4 (GAUZE/BANDAGES/DRESSINGS) ×1
CLSR STERI-STRIP ANTIMIC 1/2X4 (GAUZE/BANDAGES/DRESSINGS) ×3 IMPLANT
COOLER ICEMAN CLASSIC (MISCELLANEOUS) ×4 IMPLANT
COVER BACK TABLE 60X90IN (DRAPES) ×4 IMPLANT
COVER WAND RF STERILE (DRAPES) IMPLANT
CUFF TOURN SGL QUICK 34 (TOURNIQUET CUFF) ×4
CUFF TRNQT CYL 34X4.125X (TOURNIQUET CUFF) ×2 IMPLANT
DISSECTOR 3.5MM X 13CM CVD (MISCELLANEOUS) ×4 IMPLANT
DISSECTOR 4.0MMX13CM CVD (MISCELLANEOUS) IMPLANT
DRAPE ARTHROSCOPY W/POUCH 90 (DRAPES) ×4 IMPLANT
DRAPE C-ARM 42X72 X-RAY (DRAPES) ×4 IMPLANT
DRAPE C-ARMOR (DRAPES) ×4 IMPLANT
DRAPE IMP U-DRAPE 54X76 (DRAPES) ×4 IMPLANT
DRAPE TOP ARMCOVERS (MISCELLANEOUS) ×4 IMPLANT
DRAPE U-SHAPE 47X51 STRL (DRAPES) ×4 IMPLANT
DRSG EMULSION OIL 3X3 NADH (GAUZE/BANDAGES/DRESSINGS) IMPLANT
ELECT REM PT RETURN 9FT ADLT (ELECTROSURGICAL) ×4
ELECTRODE REM PT RTRN 9FT ADLT (ELECTROSURGICAL) ×2 IMPLANT
GAUZE SPONGE 4X4 12PLY STRL (GAUZE/BANDAGES/DRESSINGS) ×8 IMPLANT
GLOVE BIO SURGEON STRL SZ 6 (GLOVE) ×4 IMPLANT
GLOVE BIO SURGEON STRL SZ 6.5 (GLOVE) ×6 IMPLANT
GLOVE BIO SURGEONS STRL SZ 6.5 (GLOVE) ×2
GLOVE BIOGEL PI IND STRL 6.5 (GLOVE) ×4 IMPLANT
GLOVE BIOGEL PI IND STRL 7.0 (GLOVE) ×2 IMPLANT
GLOVE BIOGEL PI IND STRL 8 (GLOVE) ×2 IMPLANT
GLOVE BIOGEL PI INDICATOR 6.5 (GLOVE) ×4
GLOVE BIOGEL PI INDICATOR 7.0 (GLOVE) ×2
GLOVE BIOGEL PI INDICATOR 8 (GLOVE) ×2
GLOVE ECLIPSE 6.5 STRL STRAW (GLOVE) ×4 IMPLANT
GLOVE ECLIPSE 8.0 STRL XLNG CF (GLOVE) ×4 IMPLANT
GOWN STRL REUS W/ TWL LRG LVL3 (GOWN DISPOSABLE) ×6 IMPLANT
GOWN STRL REUS W/ TWL XL LVL3 (GOWN DISPOSABLE) IMPLANT
GOWN STRL REUS W/TWL LRG LVL3 (GOWN DISPOSABLE) ×12
GOWN STRL REUS W/TWL XL LVL3 (GOWN DISPOSABLE) ×4 IMPLANT
IMMOBILIZER KNEE 22  40 CIR (ORTHOPEDIC SUPPLIES) ×2
IMMOBILIZER KNEE 22 40 CIR (ORTHOPEDIC SUPPLIES) ×2 IMPLANT
IMMOBILIZER KNEE 22 UNIV (SOFTGOODS) ×4 IMPLANT
IMMOBILIZER KNEE 24 THIGH 36 (MISCELLANEOUS) IMPLANT
IMMOBILIZER KNEE 24 UNIV (MISCELLANEOUS)
IV NS IRRIG 3000ML ARTHROMATIC (IV SOLUTION) ×12 IMPLANT
KIT BIO-SUTURETAK 2.4 SPR TROC (KITS) ×4 IMPLANT
KIT SUTURETAK 3 SPEAR TROCAR (KITS) IMPLANT
KIT TRANSTIBIAL (DISPOSABLE) ×4 IMPLANT
KNEE WRAP E Z 3 GEL PACK (MISCELLANEOUS) IMPLANT
MANIFOLD NEPTUNE II (INSTRUMENTS) ×4 IMPLANT
NDL SAFETY ECLIPSE 18X1.5 (NEEDLE) IMPLANT
NDL SUT 6 .5 CRC .975X.05 MAYO (NEEDLE) IMPLANT
NEEDLE HYPO 18GX1.5 SHARP (NEEDLE)
NEEDLE MAYO TAPER (NEEDLE)
PACK ARTHROSCOPY DSU (CUSTOM PROCEDURE TRAY) ×4 IMPLANT
PACK BASIN DAY SURGERY FS (CUSTOM PROCEDURE TRAY) ×4 IMPLANT
PAD COLD SHLDR WRAP-ON (PAD) ×4 IMPLANT
PENCIL SMOKE EVACUATOR (MISCELLANEOUS) ×4 IMPLANT
PORT APPOLLO RF 90DEGREE MULTI (SURGICAL WAND) IMPLANT
SCREW PEEK INT. 7X30 (Screw) ×4 IMPLANT
SHEET MEDIUM DRAPE 40X70 STRL (DRAPES) ×4 IMPLANT
SPONGE LAP 4X18 RFD (DISPOSABLE) ×4 IMPLANT
SUT FIBERWIRE #2 38 T-5 BLUE (SUTURE) ×4
SUT MNCRL AB 4-0 PS2 18 (SUTURE) ×4 IMPLANT
SUT VIC AB 0 CT1 27 (SUTURE) ×4
SUT VIC AB 0 CT1 27XBRD ANBCTR (SUTURE) ×2 IMPLANT
SUT VIC AB 3-0 SH 27 (SUTURE) ×4
SUT VIC AB 3-0 SH 27X BRD (SUTURE) ×2 IMPLANT
SUTURE FIBERWR #2 38 T-5 BLUE (SUTURE) ×2 IMPLANT
SUTURE TAPE 1.3 FIBERLOP 20 ST (SUTURE) ×2 IMPLANT
SUTURETAPE 1.3 FIBERLOOP 20 ST (SUTURE) ×4
SYR 5ML LL (SYRINGE) IMPLANT
TAPE CLOTH 3X10 TAN LF (GAUZE/BANDAGES/DRESSINGS) IMPLANT
TENDON SEMI-TENDINOSUS (Bone Implant) ×4 IMPLANT
TOWEL GREEN STERILE FF (TOWEL DISPOSABLE) ×8 IMPLANT
TUBE SUCTION HIGH CAP CLEAR NV (SUCTIONS) ×4 IMPLANT
TUBING ARTHROSCOPY IRRIG 16FT (MISCELLANEOUS) ×4 IMPLANT

## 2020-07-06 NOTE — Progress Notes (Signed)
Assisted Dr. Miller with left, ultrasound guided, adductor canal block. Side rails up, monitors on throughout procedure. See vital signs in flow sheet. Tolerated Procedure well.  

## 2020-07-06 NOTE — Interval H&P Note (Signed)
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  The various methods of treatment have been discussed with the patient and family. After consideration of risks, benefits and other options for treatment, the patient has consented to  Procedure(s): LEFT ARTHROSCOPY KNEE REMOVAL OF LOOSE FOREIGN BODY AND LIGAMENT RECONSTRUCTION KNEE EXTRA ARTICULAR (Left) as a surgical intervention.  The patient's history has been reviewed, patient examined, no change in status, stable for surgery.  I have reviewed the patient's chart and labs.  Questions were answered to the patient's satisfaction.     Bjorn Pippin

## 2020-07-06 NOTE — Anesthesia Preprocedure Evaluation (Signed)
Anesthesia Evaluation  Patient identified by MRN, date of birth, ID band Patient awake    Reviewed: Allergy & Precautions, NPO status , Patient's Chart, lab work & pertinent test results  Airway Mallampati: II  TM Distance: >3 FB Neck ROM: Full    Dental no notable dental hx.    Pulmonary neg pulmonary ROS, former smoker,    Pulmonary exam normal breath sounds clear to auscultation       Cardiovascular negative cardio ROS Normal cardiovascular exam Rhythm:Regular Rate:Normal     Neuro/Psych Depression negative neurological ROS  negative psych ROS   GI/Hepatic negative GI ROS, Neg liver ROS,   Endo/Other  negative endocrine ROS  Renal/GU negative Renal ROS  negative genitourinary   Musculoskeletal negative musculoskeletal ROS (+)   Abdominal   Peds negative pediatric ROS (+)  Hematology negative hematology ROS (+)   Anesthesia Other Findings   Reproductive/Obstetrics negative OB ROS                             Anesthesia Physical Anesthesia Plan  ASA: II  Anesthesia Plan: General   Post-op Pain Management:  Regional for Post-op pain   Induction: Intravenous  PONV Risk Score and Plan: 3 and Ondansetron, Dexamethasone, Midazolam and Treatment may vary due to age or medical condition  Airway Management Planned: LMA  Additional Equipment:   Intra-op Plan:   Post-operative Plan: Extubation in OR  Informed Consent: I have reviewed the patients History and Physical, chart, labs and discussed the procedure including the risks, benefits and alternatives for the proposed anesthesia with the patient or authorized representative who has indicated his/her understanding and acceptance.     Dental advisory given  Plan Discussed with: CRNA  Anesthesia Plan Comments:         Anesthesia Quick Evaluation

## 2020-07-06 NOTE — Transfer of Care (Signed)
Immediate Anesthesia Transfer of Care Note  Patient: Tammy Lowe  Procedure(s) Performed: LEFT ARTHROSCOPY KNEE REMOVAL OF LOOSE FOREIGN BODY AND LIGAMENT RECONSTRUCTION KNEE EXTRA ARTICULAR, MICROFRACTURE (Left Knee) KNEE ARTHROSCOPY WITH DRILLING/MICROFRACTURE (Knee)  Patient Location: PACU  Anesthesia Type:GA combined with regional for post-op pain  Level of Consciousness: drowsy  Airway & Oxygen Therapy: Patient Spontanous Breathing and Patient connected to nasal cannula oxygen  Post-op Assessment: Report given to RN and Post -op Vital signs reviewed and stable  Post vital signs: Reviewed and stable  Last Vitals:  Vitals Value Taken Time  BP 140/93 07/06/20 1010  Temp 36.6 C 07/06/20 1010  Pulse 103 07/06/20 1012  Resp 12 07/06/20 1012  SpO2 100 % 07/06/20 1012  Vitals shown include unvalidated device data.  Last Pain:  Vitals:   07/06/20 0728  TempSrc: Oral  PainSc:          Complications: No complications documented.

## 2020-07-06 NOTE — Anesthesia Postprocedure Evaluation (Signed)
Anesthesia Post Note  Patient: Tammy Lowe  Procedure(s) Performed: LEFT ARTHROSCOPY KNEE REMOVAL OF LOOSE FOREIGN BODY AND LIGAMENT RECONSTRUCTION KNEE EXTRA ARTICULAR, MICROFRACTURE (Left Knee) KNEE ARTHROSCOPY WITH DRILLING/MICROFRACTURE (Knee)     Patient location during evaluation: PACU Anesthesia Type: General Level of consciousness: awake and alert Pain management: pain level controlled Vital Signs Assessment: post-procedure vital signs reviewed and stable Respiratory status: spontaneous breathing, nonlabored ventilation and respiratory function stable Cardiovascular status: blood pressure returned to baseline and stable Postop Assessment: no apparent nausea or vomiting Anesthetic complications: no   No complications documented.  Last Vitals:  Vitals:   07/06/20 1100 07/06/20 1115  BP: (!) 140/101 128/87  Pulse: 94 95  Resp: 14 13  Temp:    SpO2: 96% 97%    Last Pain:  Vitals:   07/06/20 1045  TempSrc:   PainSc: Asleep                 Lowella Curb

## 2020-07-06 NOTE — Op Note (Signed)
Orthopaedic Surgery Operative Note (CSN: [id number])  Tammy Lowe  05-29-1996 Date of Surgery: 07/06/2020   Diagnoses:  Left knee traumatic patellar dislocation with loose osteochondral fragments  Procedure: Left MPFL reconstruction with semitendinosus allograft Multiple loose body excision Lateral femoral condyle far lateral microfracture   Operative Finding Exam under anesthesia: Full motion no limitation, large effusion, ligaments exam is normal Suprapatellar pouch: Loose bodies noted x1 removed en bloc Patellofemoral Compartment: Dysplastic trochlea but no cartilage damage on the trochlea there is medial full-thickness cartilage loss of the far medial aspect of the patella and the medial patellar facet had fraying and grade 2-3 changes diffusely that look traumatic.  This was debrided back to stable base.  The full-thickness loss on the patella was relatively minimal and would not need further care most likely. Medial Compartment: Normal Lateral Compartment: Lateral femoral condyle had a 28 x 15 mm loss of cartilage from the far lateral aspect of the condyle that was uncontained.  There was some chondral and osseous component to this.  This lesion engaged from 0 to 90 degrees of flexion and was mostly over top of the lateral meniscus.  Due to the patient's inability to obtain therapy and her insurance status we worried that bring her back for secondary surgery would be extremely morbid.  We performed a microfracture as this was the best possible option we could do considering her social situation.  If she fails this she would need a offloading osteotomy or potentially an osteochondral transplant however the far lateral nature of this would mean that she needs multiple small plugs and we would leave the far lateral aspect uncovered. Intercondylar Notch: Multiple loose bodies x3 that were removed  Successful completion of the planned procedure.  Patient's situation is rather complex due  to her social situation and her lack of insurance.  We worry about her long-term health of her lateral joint.  We think that she may be a candidate for an osteochondral allograft transplant with multiple small plugs rather than 1 large 1 or a very narrow bio uni-.  She may also be a candidate for a distal femoral osteotomy to adjust her alignment if she is in valgus.  Post-operative plan: The patient will be nonweightbearing for 6 weeks with early range of motion and self-directed therapy.  The patient will be discharged home.  DVT prophylaxis Aspirin 81 mg twice daily for 6 weeks.  Pain control with PRN pain medication preferring oral medicines.  Follow up plan will be scheduled in approximately 7 days for incision check and XR.  Post-Op Diagnosis: Same Surgeons:Primary: Bjorn Pippin, MD Assistants:Caroline McBane PA-C Location: [facility] OR ROOM 6 Anesthesia: General with abductor canal Antibiotics: Ancef 2 g with local vancomycin powder 1 g at the surgical site Tourniquet time:  Total Tourniquet Time Documented: Thigh (Left) - 60 minutes Total: Thigh (Left) - 60 minutes  Estimated Blood Loss: Minimal Complications: None Specimens: None Implants: Implant Name Type Inv. Item Serial No. Manufacturer Lot No. LRB No. Used Action  TENDON SEMI-TENDINOSUS - [id number] Bone Implant TENDON SEMI-TENDINOSUS [id number] [employer] [id number] Left 1 Implanted  SCREW PEEK INT. 7X30 - [id number] Screw SCREW PEEK INT. 7X30  [employer] [id number] Left 1 Implanted  SUTRURETAK BIOCOMPOSITE - [id number] Anchor SUTRURETAK BIOCOMPOSITE  [employer] [id number] Left 1 Implanted  SUTRURETAK BIOCOMPOSITE - [id number] Anchor SUTRURETAK BIOCOMPOSITE  [employer] [id number] Left 1 Implanted    Indications for Surgery:   Tammy Lowe is a 24 y.o. female  with patellofemoral dislocation multiple osteochondral loose bodies.  We discussed the patient a complex issue and she may not have a perfect  solution considering her social situation and her lack of ability get therapy after surgery.  Benefits and risks of operative and nonoperative management were discussed prior to surgery with patient/guardian(s) and informed consent form was completed.  Specific risks including infection, need for additional surgery, continued pain, arthrosis, stiffness, need for further manipulation and lysis of adhesions.   Procedure:   The patient was identified properly. Informed consent was obtained and the surgical site was marked. The patient was taken up to suite where general anesthesia was induced. The patient was placed in the supine position with a post against the surgical leg and a nonsterile tourniquet applied. The surgical leg was then prepped and draped usual sterile fashion.  A standard surgical timeout was performed.  2 standard anterior portals were made and diagnostic arthroscopy performed. Please note the findings as noted above.  We saw multiple loose bodies and these were removed x3 all greater than 1 x 1 cm.  We cleared the joint of any other loose fragments with a shaver.  We cleared the medial and the patellofemoral compartment and performed a chondroplasty of the medial patellar facet.  We then identified the far lateral aspect of the lateral femoral condyle which had clear chondral defect.  We made stable shoulders debrided the base and performed a microfracture even though the lesion was quite large at 28 x 15 as the patient did not have great option secondary to uncontained lesion far lateral.  Once this was performed with a 0.062 K wire we turned our attention to the MPFL.  Attention was turned to the proximal medial patella where a proximal medial patellar skin incision was made and carried down through the skin and subcutaneous tissue.  The medial border of the patella was exposed down to layer 3.  We tagged the superficial tissue which was consistent with the attenuated MPFL remnant.  The  joint was not entered.  We then used 2 - 2.4 mm arthrex suturetak anchor placed at the proximal 25% and 50% marks of the patella from proximal to distal transversely.  These would be used to hold our graft in place using a luggage loop type suture pass.    Our graft was prepped in the form of a doubled over tibialis anterior graft that passed through a 6.22mm tunnel.   This was secured as above to the patella at its mid portion and the two loose tails were then passed under layer 2 to the medial epicondyle.  We then made a 3 cm approach starting at the medial epicondyle extending just proximal and posterior.  We took care to dissect the superficial tissues bluntly and used blunt retraction to ensure that the neurovascular structures were out of our field.   We identified the medial epicondyle.  Blunt dissection was performed below the fascia outside of the capsule from the medial patella to the adductor tubercle.    Using a Beath pin under fluoroscopy image intensification, the Beath pin was placed at [place] and placed from a posterior to anterior and distal to proximal direction exiting the lateral thigh.  Good position was noted on the fluoroscopic views.  The Beath pin and the adductor tubercle was over reamed with a 45mm cannulated reamer to the far lateral femoral cortex.  The sutures from the semitendinosus graft were then passed used the Beath pin exiting laterally.  With the knee in 30 degrees of flexion, the graft was appropriately tensioned to allow for appropriate medial lateral stability with approximately 59mm of lateral translation without being excessively tight.  Excellent tension was noted.  A guidepin was then placed in the femoral tunnel and the graft was secured using a 7x30-mm Arthrex peek screw with excellent purchase noted and the medial patellofemoral ligament graft appropriately tensioned.  There was adequate medial lateral stability, but the patella was not excessively tight.   The arthroscope was placed back in the joint to check position and translation of the patella before and after graft fixation noting it to be stable and articulating within the trochlea.  The native MPFL tissue was repaired at both its patellar and femoral origins in a pants over vest style fashion to imbricate this loose tissue with 0 Vicryl.  All incisions were irrigated copiously and vancomycin powder was placed prior to closure in a multilayer fashion with absorbable suture.  Sterile dressing and a knee immobilizer type brace were placed.  The patient was awoken from general anesthesia and taken to the PACU in stable condition without complication.   Alfonse Alpers, PA-C, present and scrubbed throughout the case, critical for completion in a timely fashion, and for retraction, instrumentation, closure.

## 2020-07-06 NOTE — Addendum Note (Signed)
Addendum  created 07/06/20 1359 by Cashis Rill, Jewel Baize, CRNA   Charge Capture section accepted

## 2020-07-06 NOTE — Anesthesia Procedure Notes (Signed)
Procedure Name: LMA Insertion Date/Time: 07/06/2020 8:47 AM Performed by: Elliot Dally, CRNA Pre-anesthesia Checklist: Patient identified, Emergency Drugs available, Suction available and Patient being monitored Patient Re-evaluated:Patient Re-evaluated prior to induction Oxygen Delivery Method: Circle System Utilized Preoxygenation: Pre-oxygenation with 100% oxygen Induction Type: IV induction Ventilation: Mask ventilation without difficulty LMA: LMA inserted LMA Size: 4.0 Number of attempts: 1 Airway Equipment and Method: Bite block Placement Confirmation: positive ETCO2 Tube secured with: Tape Dental Injury: Teeth and Oropharynx as per pre-operative assessment

## 2020-07-06 NOTE — Discharge Instructions (Signed)
  Post Anesthesia Home Care Instructions  Activity: Get plenty of rest for the remainder of the day. A responsible individual must stay with you for 24 hours following the procedure.  For the next 24 hours, DO NOT: -Drive a car -Operate machinery -Drink alcoholic beverages -Take any medication unless instructed by your physician -Make any legal decisions or sign important papers.  Meals: Start with liquid foods such as gelatin or soup. Progress to regular foods as tolerated. Avoid greasy, spicy, heavy foods. If nausea and/or vomiting occur, drink only clear liquids until the nausea and/or vomiting subsides. Call your physician if vomiting continues.  Special Instructions/Symptoms: Your throat may feel dry or sore from the anesthesia or the breathing tube placed in your throat during surgery. If this causes discomfort, gargle with warm salt water. The discomfort should disappear within 24 hours.     Regional Anesthesia Blocks  1. Numbness or the inability to move the "blocked" extremity may last from 3-48 hours after placement. The length of time depends on the medication injected and your individual response to the medication. If the numbness is not going away after 48 hours, call your surgeon.  2. The extremity that is blocked will need to be protected until the numbness is gone and the  Strength has returned. Because you cannot feel it, you will need to take extra care to avoid injury. Because it may be weak, you may have difficulty moving it or using it. You may not know what position it is in without looking at it while the block is in effect.  3. For blocks in the legs and feet, returning to weight bearing and walking needs to be done carefully. You will need to wait until the numbness is entirely gone and the strength has returned. You should be able to move your leg and foot normally before you try and bear weight or walk. You will need someone to be with you when you first try to  ensure you do not fall and possibly risk injury.  4. Bruising and tenderness at the needle site are common side effects and will resolve in a few days.  5. Persistent numbness or new problems with movement should be communicated to the surgeon or the [facility] ([phone])/ [facility] ([phone]). 

## 2020-07-06 NOTE — Anesthesia Procedure Notes (Signed)
Anesthesia Regional Block: Adductor canal block   Pre-Anesthetic Checklist: ,, timeout performed, Correct Patient, Correct Site, Correct Laterality, Correct Procedure, Correct Position, site marked, Risks and benefits discussed,  Surgical consent,  Pre-op evaluation,  At surgeon's request and post-op pain management  Laterality: Left  Prep: chloraprep       Needles:  Injection technique: Single-shot  Needle Type: Stimiplex     Needle Length: 9cm  Needle Gauge: 21     Additional Needles:   Procedures:,,,, ultrasound used (permanent image in chart),,,,  Narrative:  Start time: 07/06/2020 8:18 AM End time: 07/06/2020 8:23 AM Injection made incrementally with aspirations every 5 mL.  Performed by: Personally  Anesthesiologist: Lowella Curb, MD

## 2020-07-10 ENCOUNTER — Encounter (HOSPITAL_BASED_OUTPATIENT_CLINIC_OR_DEPARTMENT_OTHER): Payer: Self-pay | Admitting: Orthopaedic Surgery

## 2021-11-04 ENCOUNTER — Other Ambulatory Visit: Payer: Self-pay

## 2021-11-04 ENCOUNTER — Emergency Department (HOSPITAL_COMMUNITY)
Admission: EM | Admit: 2021-11-04 | Discharge: 2021-11-04 | Disposition: A | Discharge status: Home / Self Care | Payer: Self-pay | Attending: Emergency Medicine | Admitting: Emergency Medicine

## 2021-11-04 ENCOUNTER — Encounter (HOSPITAL_COMMUNITY): Payer: Self-pay

## 2021-11-04 DIAGNOSIS — J029 Acute pharyngitis, unspecified: Secondary | ICD-10-CM | POA: Insufficient documentation

## 2021-11-04 DIAGNOSIS — Z20822 Contact with and (suspected) exposure to covid-19: Secondary | ICD-10-CM | POA: Insufficient documentation

## 2021-11-04 LAB — RESP PANEL BY RT-PCR (FLU A&B, COVID) ARPGX2
Influenza A by PCR: NEGATIVE
Influenza B by PCR: NEGATIVE
SARS Coronavirus 2 by RT PCR: NEGATIVE

## 2021-11-04 LAB — GROUP A STREP BY PCR: Group A Strep by PCR: NOT DETECTED

## 2021-11-04 NOTE — ED Provider Notes (Signed)
[facility]-EMERGENCY DEPT Provider Note   CSN: [id number] Arrival date & time: 11/04/21  1424     History  Chief Complaint  Patient presents with   Sore Throat    Tammy Lowe is a 26 y.o. female.  26 year old female with prior medical history as detailed below presents for evaluation.  Patient reports 5 to 6 days of sore throat.  Symptoms are mild.  She denies associated fever.  She has used DayQuil and NyQuil with some relief of symptoms.  She denies other sick contacts.  She denies significant runny nose, cough, congestion, difficulty swallowing, or change in voice.  The history is provided by the patient.  Sore Throat This is a new problem. The current episode started more than 2 days ago. The problem occurs constantly. The problem has not changed since onset.Pertinent negatives include no chest pain and no abdominal pain. Nothing aggravates the symptoms. Nothing relieves the symptoms.      Home Medications Prior to Admission medications   Medication Sig Start Date End Date Taking? Authorizing Provider  gabapentin (NEURONTIN) 100 MG capsule Take 100 mg by mouth 3 (three) times daily.    [provider]  methocarbamol (ROBAXIN) 500 MG tablet Take 1 tablet (500 mg total) by mouth every 8 (eight) hours as needed for muscle spasms. 07/06/20   Vernetta Honey, PA-C      Allergies    Patient has no known allergies.    Review of Systems   Review of Systems  Cardiovascular:  Negative for chest pain.  Gastrointestinal:  Negative for abdominal pain.  All other systems reviewed and are negative.  Physical Exam Updated Vital Signs BP (!) 124/97 (BP Location: Left Arm)    Pulse 82    Temp 97.7 F (36.5 C) (Oral)    Resp 16    Ht 5' 7.5" (1.715 m)    Wt 58.1 kg    SpO2 97%    BMI 19.75 kg/m  Physical Exam Vitals and nursing note reviewed.  Constitutional:      General: She is not in acute distress.    Appearance: Normal appearance. She is  well-developed.  HENT:     Head: Normocephalic and atraumatic.     Right Ear: Tympanic membrane and ear canal normal. No drainage.     Left Ear: Tympanic membrane and ear canal normal. No drainage.     Nose: No congestion or rhinorrhea.     Mouth/Throat:     Mouth: Mucous membranes are moist.     Pharynx: Posterior oropharyngeal erythema present.     Comments: Mild erythema of the posterior pharynx with slight exudates noted on bilateral tonsils.  Uvula midline. Eyes:     Conjunctiva/sclera: Conjunctivae normal.     Pupils: Pupils are equal, round, and reactive to light.  Cardiovascular:     Rate and Rhythm: Normal rate and regular rhythm.     Heart sounds: Normal heart sounds.  Pulmonary:     Effort: Pulmonary effort is normal. No respiratory distress.     Breath sounds: Normal breath sounds.  Abdominal:     General: There is no distension.     Palpations: Abdomen is soft.     Tenderness: There is no abdominal tenderness.  Musculoskeletal:        General: No deformity. Normal range of motion.     Cervical back: Normal range of motion and neck supple.  Skin:    General: Skin is warm and dry.  Neurological:  General: No focal deficit present.     Mental Status: She is alert and oriented to person, place, and time.    ED Results / Procedures / Treatments   Labs (all labs ordered are listed, but only abnormal results are displayed) Labs Reviewed  GROUP A STREP BY PCR  RESP PANEL BY RT-PCR (FLU A&B, COVID) [id number]    EKG None  Radiology No results found.  Procedures Procedures    Medications Ordered in ED Medications - No data to display  ED Course/ Medical Decision Making/ A&P                           Medical Decision Making   Medical Screen Complete  This patient presented to the ED with complaint of sore throat.  This complaint involves an extensive number of treatment options. The initial differential diagnosis includes, but is not limited to,  viral pharyngitis, strep pharyngitis, viral infection, bacterial infection, etc.  This presentation is: Acute, Self-Limited, Previously Undiagnosed, Uncertain Prognosis, Complicated, Systemic Symptoms, and Threat to Life/Bodily Function  Patient complains of sore throat.  Symptoms described are mild.  Onset of symptoms was 5 days prior.  Patient without evidence on exam of significant acute pathology.  Presentation is consistent with resolving viral pharyngitis.  Strep test is negative.Marland Kitchen  COVID and flu test pending.  Patient is agreeable to following up on these results to the MyChart system.  Importance of close follow-up is stressed.  Strict return precautions given and understood.       Additional history obtained:  External records from outside sources obtained and reviewed including prior ED visits and prior Inpatient records.    Lab Tests:  I ordered and personally interpreted labs.  The pertinent results include:  rapid strep covid/flu     Problem List / ED Course:  Viral pharyngitis   Reevaluation:  After the interventions noted above, I reevaluated the patient and found that they have: improved   Disposition:  After consideration of the diagnostic results and the patients response to treatment, I feel that the patent would benefit from close outpatient followup.            Final Clinical Impression(s) / ED Diagnoses Final diagnoses:  Viral pharyngitis    Rx / DC Orders ED Discharge Orders     None         Wynetta Fines, MD 11/04/21 1555

## 2021-11-04 NOTE — ED Notes (Signed)
Pt NAD, a/ox4. Pt verbalizes understanding of all DC and f/u instructions. All questions answered. Pt walks with steady gait to lobby at DC.  ? ?

## 2021-11-04 NOTE — Discharge Instructions (Addendum)
Return for any problem.   COVID and flu testing results will be available through the MyChart system later today.

## 2021-11-04 NOTE — ED Triage Notes (Signed)
Pt reports sore throat x1 week and noticing some white patches to her throat.

## 2022-04-24 IMAGING — RF DG C-ARM 1-60 MIN
1 series · 2 of 2 positions shown · non-contrast
Comparison: Preoperative imaging.

CLINICAL DATA: Elective surgery.

EXAM:
DG C-ARM 1-60 MIN; LEFT KNEE - 3 VIEW
FLUOROSCOPY TIME:  Fluoroscopy Time:  2 seconds
Radiation Exposure Index (if provided by the fluoroscopic device):
0.21 mGy
Number of Acquired Spot Images: 2

[Series 1: run · 2 of 2 slices shown]
[im 1/2]
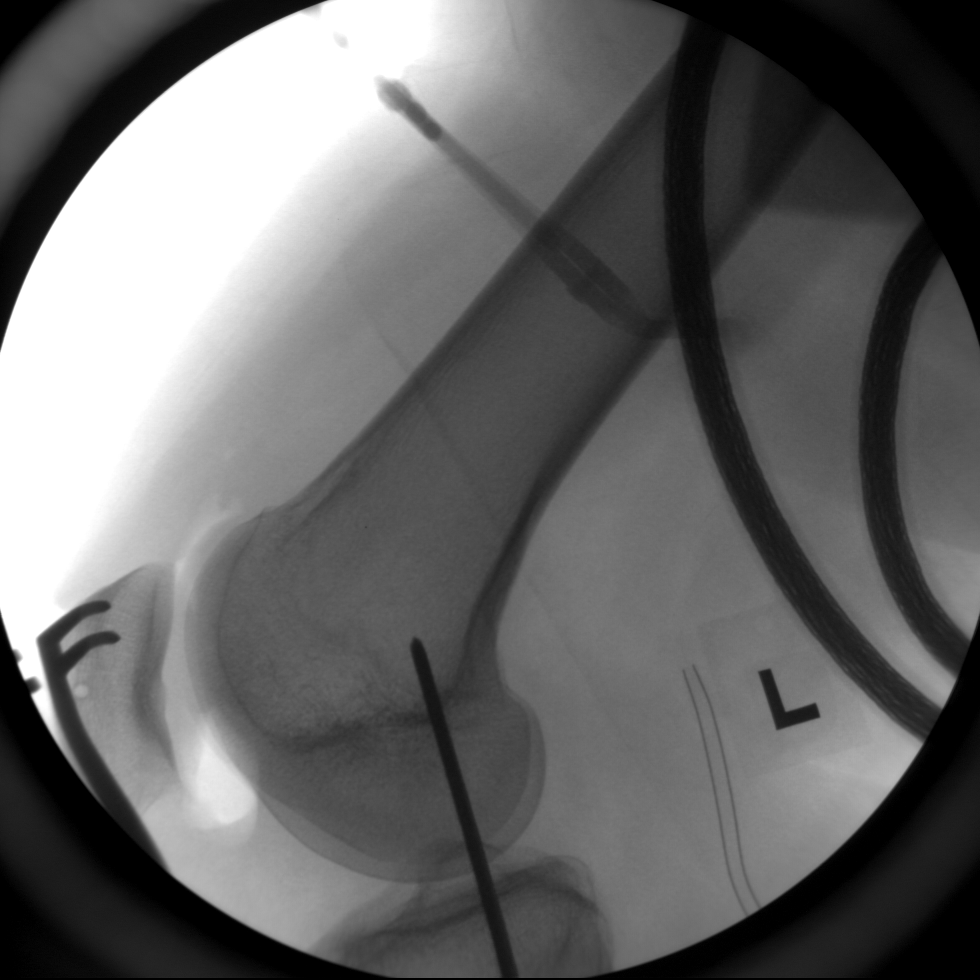
[im 2/2]
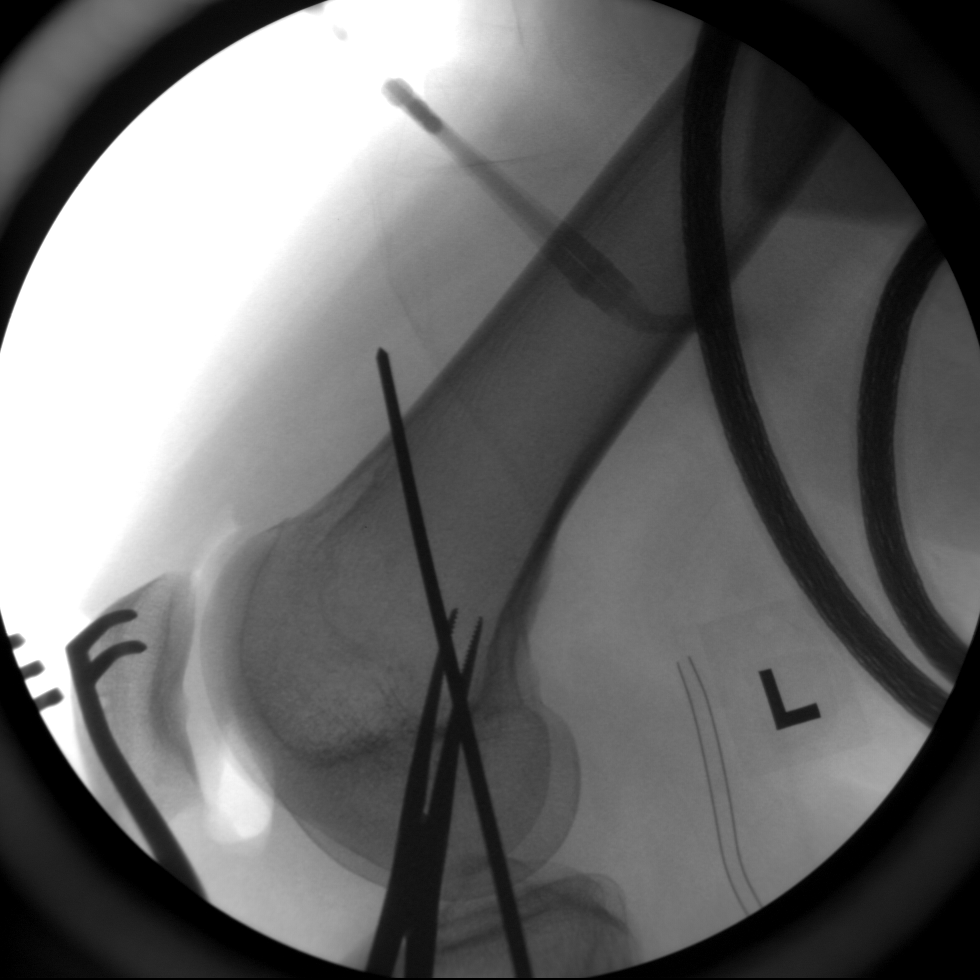

[2 of 2 positions shown; findings below may reference images not displayed]

FINDINGS: Two lateral fluoroscopic spot views obtained in the operating room.
Surgical instruments project over the left knee.
IMPRESSION: Procedural fluoroscopy during left knee surgery.

## 2024-03-17 DIAGNOSIS — Z3400 Encounter for supervision of normal first pregnancy, unspecified trimester: Secondary | ICD-10-CM | POA: Diagnosis not present

## 2024-03-17 DIAGNOSIS — F332 Major depressive disorder, recurrent severe without psychotic features: Secondary | ICD-10-CM | POA: Diagnosis not present

## 2024-03-17 DIAGNOSIS — O283 Abnormal ultrasonic finding on antenatal screening of mother: Secondary | ICD-10-CM | POA: Diagnosis not present

## 2024-03-17 DIAGNOSIS — O0932 Supervision of pregnancy with insufficient antenatal care, second trimester: Secondary | ICD-10-CM | POA: Diagnosis not present

## 2024-03-17 DIAGNOSIS — Z23 Encounter for immunization: Secondary | ICD-10-CM | POA: Diagnosis not present

## 2024-04-14 DIAGNOSIS — Z1330 Encounter for screening examination for mental health and behavioral disorders, unspecified: Secondary | ICD-10-CM | POA: Diagnosis not present

## 2024-04-14 DIAGNOSIS — O0932 Supervision of pregnancy with insufficient antenatal care, second trimester: Secondary | ICD-10-CM | POA: Diagnosis not present

## 2024-04-14 DIAGNOSIS — F332 Major depressive disorder, recurrent severe without psychotic features: Secondary | ICD-10-CM | POA: Diagnosis not present

## 2024-04-14 DIAGNOSIS — O283 Abnormal ultrasonic finding on antenatal screening of mother: Secondary | ICD-10-CM | POA: Diagnosis not present

## 2024-04-14 DIAGNOSIS — Z3400 Encounter for supervision of normal first pregnancy, unspecified trimester: Secondary | ICD-10-CM | POA: Diagnosis not present

## 2024-04-14 DIAGNOSIS — O26843 Uterine size-date discrepancy, third trimester: Secondary | ICD-10-CM | POA: Diagnosis not present

## 2024-04-29 DIAGNOSIS — Z3400 Encounter for supervision of normal first pregnancy, unspecified trimester: Secondary | ICD-10-CM | POA: Diagnosis not present

## 2024-04-29 DIAGNOSIS — O26843 Uterine size-date discrepancy, third trimester: Secondary | ICD-10-CM | POA: Diagnosis not present

## 2024-04-29 DIAGNOSIS — F332 Major depressive disorder, recurrent severe without psychotic features: Secondary | ICD-10-CM | POA: Diagnosis not present

## 2024-04-29 DIAGNOSIS — O283 Abnormal ultrasonic finding on antenatal screening of mother: Secondary | ICD-10-CM | POA: Diagnosis not present

## 2024-04-29 DIAGNOSIS — O0932 Supervision of pregnancy with insufficient antenatal care, second trimester: Secondary | ICD-10-CM | POA: Diagnosis not present

## 2024-05-07 DIAGNOSIS — Z3400 Encounter for supervision of normal first pregnancy, unspecified trimester: Secondary | ICD-10-CM | POA: Diagnosis not present

## 2024-05-20 DIAGNOSIS — O26843 Uterine size-date discrepancy, third trimester: Secondary | ICD-10-CM | POA: Diagnosis not present

## 2024-05-26 DIAGNOSIS — O26843 Uterine size-date discrepancy, third trimester: Secondary | ICD-10-CM | POA: Diagnosis not present

## 2024-05-26 DIAGNOSIS — Z3400 Encounter for supervision of normal first pregnancy, unspecified trimester: Secondary | ICD-10-CM | POA: Diagnosis not present

## 2024-05-29 DIAGNOSIS — Z3A39 39 weeks gestation of pregnancy: Secondary | ICD-10-CM | POA: Diagnosis not present

## 2024-05-29 DIAGNOSIS — Z3403 Encounter for supervision of normal first pregnancy, third trimester: Secondary | ICD-10-CM | POA: Diagnosis not present

## 2024-05-29 DIAGNOSIS — D62 Acute posthemorrhagic anemia: Secondary | ICD-10-CM | POA: Diagnosis not present

## 2024-05-29 DIAGNOSIS — O9081 Anemia of the puerperium: Secondary | ICD-10-CM | POA: Diagnosis not present

## 2024-07-12 DIAGNOSIS — N898 Other specified noninflammatory disorders of vagina: Secondary | ICD-10-CM | POA: Diagnosis not present

## 2024-08-02 DIAGNOSIS — Z30430 Encounter for insertion of intrauterine contraceptive device: Secondary | ICD-10-CM | POA: Diagnosis not present

## 2024-08-02 DIAGNOSIS — Z01812 Encounter for preprocedural laboratory examination: Secondary | ICD-10-CM | POA: Diagnosis not present

## 2024-09-13 DIAGNOSIS — Z01812 Encounter for preprocedural laboratory examination: Secondary | ICD-10-CM | POA: Diagnosis not present

## 2024-09-13 DIAGNOSIS — R87610 Atypical squamous cells of undetermined significance on cytologic smear of cervix (ASC-US): Secondary | ICD-10-CM | POA: Diagnosis not present

## 2024-09-15 DIAGNOSIS — R87610 Atypical squamous cells of undetermined significance on cytologic smear of cervix (ASC-US): Secondary | ICD-10-CM | POA: Diagnosis not present
